# Patient Record
Sex: Male | Born: 1945 | Race: White | Hispanic: No | Marital: Single | Smoking: Current every day smoker
Health system: Southern US, Community
[De-identification: ages and names within clinical notes are randomized; demographics above are authoritative.]

## PROBLEM LIST (undated history)

## (undated) DIAGNOSIS — R7303 Prediabetes: Secondary | ICD-10-CM

## (undated) DIAGNOSIS — I2510 Atherosclerotic heart disease of native coronary artery without angina pectoris: Secondary | ICD-10-CM

## (undated) DIAGNOSIS — I255 Ischemic cardiomyopathy: Secondary | ICD-10-CM

## (undated) DIAGNOSIS — E785 Hyperlipidemia, unspecified: Secondary | ICD-10-CM

## (undated) DIAGNOSIS — I5042 Chronic combined systolic (congestive) and diastolic (congestive) heart failure: Secondary | ICD-10-CM

---

## 2004-12-23 HISTORY — PX: CORONARY ARTERY BYPASS GRAFT: SHX141

## 2005-09-09 ENCOUNTER — Inpatient Hospital Stay (HOSPITAL_COMMUNITY): Admission: AD | Admit: 2005-09-09 | Discharge: 2005-09-14 | Payer: Self-pay | Admitting: Cardiology

## 2005-09-09 ENCOUNTER — Ambulatory Visit: Payer: Self-pay | Admitting: Cardiology

## 2005-09-09 ENCOUNTER — Encounter: Payer: Self-pay | Admitting: Emergency Medicine

## 2005-10-10 ENCOUNTER — Ambulatory Visit: Payer: Self-pay | Admitting: Cardiology

## 2011-07-25 ENCOUNTER — Emergency Department (HOSPITAL_COMMUNITY)
Admission: EM | Admit: 2011-07-25 | Discharge: 2011-07-25 | Disposition: A | Discharge status: Home / Self Care | Payer: Medicare Other | Attending: Emergency Medicine | Admitting: Emergency Medicine

## 2011-07-25 ENCOUNTER — Emergency Department (HOSPITAL_COMMUNITY): Payer: Medicare Other

## 2011-07-25 ENCOUNTER — Other Ambulatory Visit: Payer: Self-pay

## 2011-07-25 DIAGNOSIS — R112 Nausea with vomiting, unspecified: Secondary | ICD-10-CM | POA: Insufficient documentation

## 2011-07-25 DIAGNOSIS — Z951 Presence of aortocoronary bypass graft: Secondary | ICD-10-CM | POA: Insufficient documentation

## 2011-07-25 DIAGNOSIS — R0789 Other chest pain: Secondary | ICD-10-CM | POA: Insufficient documentation

## 2011-07-25 DIAGNOSIS — F17200 Nicotine dependence, unspecified, uncomplicated: Secondary | ICD-10-CM | POA: Insufficient documentation

## 2011-07-25 DIAGNOSIS — R0602 Shortness of breath: Secondary | ICD-10-CM | POA: Insufficient documentation

## 2011-07-25 DIAGNOSIS — R109 Unspecified abdominal pain: Secondary | ICD-10-CM

## 2011-07-25 DIAGNOSIS — I252 Old myocardial infarction: Secondary | ICD-10-CM | POA: Insufficient documentation

## 2011-07-25 DIAGNOSIS — I2510 Atherosclerotic heart disease of native coronary artery without angina pectoris: Secondary | ICD-10-CM | POA: Insufficient documentation

## 2011-07-25 LAB — DIFFERENTIAL
Basophils Absolute: 0 10*3/uL (ref 0.0–0.1)
Eosinophils Absolute: 0.1 10*3/uL (ref 0.0–0.7)
Eosinophils Relative: 1 % (ref 0–5)
Monocytes Relative: 6 % (ref 3–12)
Neutro Abs: 11.1 10*3/uL — ABNORMAL HIGH (ref 1.7–7.7)

## 2011-07-25 LAB — BASIC METABOLIC PANEL
CO2: 29 mEq/L (ref 19–32)
Calcium: 9.5 mg/dL (ref 8.4–10.5)
Creatinine, Ser: 0.88 mg/dL (ref 0.50–1.35)
GFR calc non Af Amer: >60 mL/min (ref >60)
Glucose, Bld: 126 mg/dL — ABNORMAL HIGH (ref 70–99)
Potassium: 4.4 mEq/L (ref 3.5–5.1)
Sodium: 137 mEq/L (ref 135–145)

## 2011-07-25 LAB — CARDIAC PANEL(CRET KIN+CKTOT+MB+TROPI)
CK, MB: 3.1 ng/mL (ref 0.3–4.0)
Relative Index: 2 (ref 0.0–2.5)

## 2011-07-25 LAB — LIPASE, BLOOD: Lipase: 23 U/L (ref 11–59)

## 2011-07-25 LAB — CBC
HCT: 43.5 % (ref 39.0–52.0)
MCH: 31.9 pg (ref 26.0–34.0)
MCHC: 34.7 g/dL (ref 30.0–36.0)
MCV: 92 fL (ref 78.0–100.0)
Platelets: 254 10*3/uL (ref 150–400)
RBC: 4.73 MIL/uL (ref 4.22–5.81)

## 2011-07-25 LAB — APTT: aPTT: 30 seconds (ref 24–37)

## 2011-07-25 LAB — HEPATIC FUNCTION PANEL
AST: 14 U/L (ref 0–37)
Alkaline Phosphatase: 82 U/L (ref 39–117)

## 2011-07-25 LAB — PROTIME-INR: INR: 0.96 (ref 0.00–1.49)

## 2011-07-25 MED ORDER — ONDANSETRON 8 MG PO TBDP: 8.0000 mg | ORAL_TABLET | Freq: Three times a day (TID) | ORAL | Status: AC | PRN

## 2011-07-25 MED ORDER — SODIUM CHLORIDE 0.9 % IV SOLN
INTRAVENOUS | Status: DC
  Administered 2011-07-25: 12:00:00 via INTRAVENOUS

## 2011-07-25 MED ORDER — HYDROMORPHONE HCL 1 MG/ML IJ SOLN
1.0000 mg | Freq: Once | INTRAMUSCULAR | Status: AC
  Administered 2011-07-25: 1 mg via INTRAVENOUS
  Filled 2011-07-25: qty 1

## 2011-07-25 MED ORDER — GI COCKTAIL ~~LOC~~
30.0000 mL | Freq: Once | ORAL | Status: AC
  Administered 2011-07-25: 30 mL via ORAL
  Filled 2011-07-25: qty 30

## 2011-07-25 NOTE — ED Notes (Signed)
EMS reports pt c/o chest pain since 9pm last night.  ALso reports some SOB.  EMS administered 2 baby aspirin in addition to the 2 asa pt took at home and ems also administered 2 nitro sprays.  Pain decreased from 3 to a 2.  Pt describes pain as a pressure in left chest and is nonradiating.

## 2011-07-25 NOTE — ED Provider Notes (Signed)
History    Chart scribed for Travis Quarry, MD by Travis Gutierrez; the patient was seen in room APA02/APA02; this patient's care was started at 12:22 PM.   CSN: [id number] Arrival date & time: 07/25/2011 11:38 AM  Chief Complaint  Patient presents with  . Chest Pain   HPI Travis Gutierrez is a 65 y.o. male who presents to the Emergency Department BIB EMS complaining of chest pressure. Pt states he was awake all night d/t constant chest pressure, rated 2-3/10, onset at 11pm last night while at rest. 2 baby ASA with no relief. Also c/o feeling bloated in his stomach and nausea with v x 3 and dry heaving. Reports "I feel like n/v is more related to my heart than my bloating." Pt states these sx are not similar to past MI sx. Pt reports stress at home d/t family, thinks may be contributing to sx. NTG en route by EMS with no relief, "maybe minimal relief". Denies recent cough, congestion, sob, or f/c. Admits to LE swelling, R>L, unchanged from baseline since MI several years ago.    Past Medical History  Diagnosis Date  . Heart attack     reports intermittent swelling in legs, takes lasix prn    Past Surgical History  Procedure Date  . Cardiac surgery     No family history on file.  History  Substance Use Topics  . Smoking status: Current Everyday Smoker  . Smokeless tobacco: Not on file  . Alcohol Use: No      Review of Systems 10 Systems reviewed and are negative for acute change except as noted in the HPI.  Physical Exam  BP 102/54  Pulse 56  SpO2 100%  Physical Exam  Nursing note and vitals reviewed. Constitutional: He is oriented to person, place, and time. He appears well-developed and well-nourished.  HENT:  Head: Normocephalic and atraumatic.  Right Ear: External ear normal.  Left Ear: External ear normal.  Nose: Nose normal.  Mouth/Throat: Oropharynx is clear and moist.  Eyes: Conjunctivae and EOM are normal. Pupils are equal, round, and reactive to light.    Neck: Normal range of motion. Neck supple.  Cardiovascular: Normal rate, regular rhythm, normal heart sounds and intact distal pulses.   Pulmonary/Chest: Effort normal and breath sounds normal.  Abdominal: Soft. Bowel sounds are normal.  Musculoskeletal: Normal range of motion.  Neurological: He is alert and oriented to person, place, and time. He has normal reflexes.  Skin: Skin is warm and dry.  Psychiatric: He has a normal mood and affect. His behavior is normal. Thought content normal.    ED Course  Procedures  MDM  Date: 07/25/2011  Rate60 Rhythm: normal sinus rhythm  QRS Axis: normal  Intervals: normal  ST/T Wave abnormalities: normal  Conduction Disutrbances:poor r wave progression  Narrative Interpretation:   Old EKG Reviewed: no significant changes noted  2:07 PM Pain improved in ED; only c/o mild aching in epigastrium which is nontender   Results for orders placed during the hospital encounter of 07/25/11  CARDIAC PANEL(CRET KIN+CKTOT+MB+TROPI)      Component Value Range   Total CK 155  7 - 232 (U/L)   CK, MB 3.1  0.3 - 4.0 (ng/mL)   Troponin I <0.30  <0.30 (ng/mL)   Relative Index 2.0  0.0 - 2.5   CBC      Component Value Range   WBC 14.8 (*) 4.0 - 10.5 (K/uL)   RBC 4.73  4.22 - 5.81 (MIL/uL)  Hemoglobin 15.1  13.0 - 17.0 (g/dL)   HCT 40.9  81.1 - 91.4 (%)   MCV 92.0  78.0 - 100.0 (fL)   MCH 31.9  26.0 - 34.0 (pg)   MCHC 34.7  30.0 - 36.0 (g/dL)   RDW 78.2  95.6 - 21.3 (%)   Platelets 254  150 - 400 (K/uL)  DIFFERENTIAL      Component Value Range   Neutrophils Relative 75  43 - 77 (%)   Neutro Abs 11.1 (*) 1.7 - 7.7 (K/uL)   Lymphocytes Relative 18  12 - 46 (%)   Lymphs Abs 2.6  0.7 - 4.0 (K/uL)   Monocytes Relative 6  3 - 12 (%)   Monocytes Absolute 0.9  0.1 - 1.0 (K/uL)   Eosinophils Relative 1  0 - 5 (%)   Eosinophils Absolute 0.1  0.0 - 0.7 (K/uL)   Basophils Relative 0  0 - 1 (%)   Basophils Absolute 0.0  0.0 - 0.1 (K/uL)  BASIC METABOLIC  PANEL      Component Value Range   Sodium 137  135 - 145 (mEq/L)   Potassium 4.4  3.5 - 5.1 (mEq/L)   Chloride 101  96 - 112 (mEq/L)   CO2 29  19 - 32 (mEq/L)   Glucose, Bld 126 (*) 70 - 99 (mg/dL)   BUN 12  6 - 23 (mg/dL)   Creatinine, Ser 0.86  0.50 - 1.35 (mg/dL)   Calcium 9.5  8.4 - 57.8 (mg/dL)   GFR calc non Af Amer >60  >60 (mL/min)   GFR calc Af Amer >60  >60 (mL/min)  LIPASE, BLOOD      Component Value Range   Lipase 23  11 - 59 (U/L)  PROTIME-INR      Component Value Range   Prothrombin Time 13.0  11.6 - 15.2 (seconds)   INR 0.96  0.00 - 1.49   APTT      Component Value Range   aPTT 30  24 - 37 (seconds)  HEPATIC FUNCTION PANEL      Component Value Range   Total Protein 7.7  6.0 - 8.3 (g/dL)   Albumin 3.9  3.5 - 5.2 (g/dL)   AST 14  0 - 37 (U/L)   ALT 15  0 - 53 (U/L)   Alkaline Phosphatase 82  39 - 117 (U/L)   Total Bilirubin 0.6  0.3 - 1.2 (mg/dL)   Bilirubin, Direct 0.1  0.0 - 0.3 (mg/dL)   Indirect Bilirubin 0.5  0.3 - 0.9 (mg/dL)   Dg Chest Portable 1 View  07/25/2011  *RADIOLOGY REPORT*  Clinical Data: Chest pain and pressure, history coronary disease post MI and bypass surgery, smoker  PORTABLE CHEST - 1 VIEW  Comparison: Portable exam 1202 hours compared to 09/12/2005  Findings: Upper normal-sized cardiac silhouette post CABG. Mediastinal contours and pulmonary vascularity normal. Minimal right base atelectasis. Lungs otherwise clear. No gross effusion or pneumothorax. Bones demineralized.  IMPRESSION: Minimal right base atelectasis. Post CABG.  Original Report Authenticated By: Lollie Marrow, M.D.   Dg Abd Acute W/chest  07/25/2011  *RADIOLOGY REPORT*  Clinical Data: Left chest pain, shortness of breath, nausea, altered mental status  ACUTE ABDOMEN SERIES (ABDOMEN 2 VIEW & CHEST 1 VIEW)  Comparison: Chest radiograph 07/25/2011  Findings: Enlargement of cardiac silhouette post CABG. Mediastinal contours and pulmonary vascularity normal. Lungs appear emphysematous  with minimal atelectasis right base. Remaining lungs clear. Minimal peribronchial thickening centrally. No pleural effusion or pneumothorax. Osseous  demineralization. Nonobstructive bowel gas pattern. No bowel dilatation, bowel wall thickening or free intraperitoneal air. No definite urinary tract calcification.  IMPRESSION: Post CABG. Question emphysematous and bronchitic changes with right base atelectasis. No acute intra abdominal findings.  Original Report Authenticated By: Lollie Marrow, M.D.      I personally performed the services described in this documentation, which was scribed in my presence. The recorded information has been reviewed and considered. Travis Quarry, MD    Travis Quarry, MD 07/26/11 [phone]

## 2011-07-25 NOTE — ED Notes (Signed)
Pt c/o left side chest pain that started last pm, sob with nausea noted as well, pain continued this am, pt called ems, pt did take 2 baby asa on his own this am and was given two additional baby asa by ems for a total of 324mg  aspirin, pt was given two nitro spray by ems with little change in pain, pt states that the pain is similar to when he had his mi in the past,

## 2014-10-16 ENCOUNTER — Other Ambulatory Visit: Payer: Self-pay

## 2014-10-16 ENCOUNTER — Encounter (HOSPITAL_COMMUNITY): Payer: Self-pay | Admitting: Emergency Medicine

## 2014-10-16 ENCOUNTER — Inpatient Hospital Stay (HOSPITAL_COMMUNITY)
Admission: EM | Admit: 2014-10-16 | Discharge: 2014-10-20 | DRG: 246 | Disposition: A | Discharge status: Home / Self Care | Payer: Medicare Other | Source: Other Acute Inpatient Hospital | Attending: Cardiovascular Disease | Admitting: Cardiovascular Disease

## 2014-10-16 ENCOUNTER — Encounter (HOSPITAL_COMMUNITY)
Admission: EM | Disposition: A | Discharge status: Home / Self Care | Payer: Medicare Other | Source: Other Acute Inpatient Hospital | Attending: Cardiovascular Disease

## 2014-10-16 DIAGNOSIS — J811 Chronic pulmonary edema: Secondary | ICD-10-CM | POA: Diagnosis not present

## 2014-10-16 DIAGNOSIS — I5043 Acute on chronic combined systolic (congestive) and diastolic (congestive) heart failure: Secondary | ICD-10-CM | POA: Diagnosis present

## 2014-10-16 DIAGNOSIS — I2582 Chronic total occlusion of coronary artery: Secondary | ICD-10-CM | POA: Diagnosis present

## 2014-10-16 DIAGNOSIS — I25119 Atherosclerotic heart disease of native coronary artery with unspecified angina pectoris: Secondary | ICD-10-CM | POA: Diagnosis present

## 2014-10-16 DIAGNOSIS — I5032 Chronic diastolic (congestive) heart failure: Secondary | ICD-10-CM

## 2014-10-16 DIAGNOSIS — I2102 ST elevation (STEMI) myocardial infarction involving left anterior descending coronary artery: Principal | ICD-10-CM | POA: Diagnosis present

## 2014-10-16 DIAGNOSIS — I5020 Unspecified systolic (congestive) heart failure: Secondary | ICD-10-CM

## 2014-10-16 DIAGNOSIS — I213 ST elevation (STEMI) myocardial infarction of unspecified site: Secondary | ICD-10-CM | POA: Diagnosis not present

## 2014-10-16 DIAGNOSIS — R7309 Other abnormal glucose: Secondary | ICD-10-CM | POA: Diagnosis present

## 2014-10-16 DIAGNOSIS — I2510 Atherosclerotic heart disease of native coronary artery without angina pectoris: Secondary | ICD-10-CM | POA: Diagnosis not present

## 2014-10-16 DIAGNOSIS — Z79899 Other long term (current) drug therapy: Secondary | ICD-10-CM | POA: Diagnosis not present

## 2014-10-16 DIAGNOSIS — Z951 Presence of aortocoronary bypass graft: Secondary | ICD-10-CM

## 2014-10-16 DIAGNOSIS — Z9114 Patient's other noncompliance with medication regimen: Secondary | ICD-10-CM | POA: Diagnosis present

## 2014-10-16 DIAGNOSIS — R0602 Shortness of breath: Secondary | ICD-10-CM

## 2014-10-16 DIAGNOSIS — I25810 Atherosclerosis of coronary artery bypass graft(s) without angina pectoris: Secondary | ICD-10-CM | POA: Diagnosis present

## 2014-10-16 DIAGNOSIS — F17210 Nicotine dependence, cigarettes, uncomplicated: Secondary | ICD-10-CM | POA: Diagnosis present

## 2014-10-16 DIAGNOSIS — I255 Ischemic cardiomyopathy: Secondary | ICD-10-CM | POA: Diagnosis present

## 2014-10-16 DIAGNOSIS — I219 Acute myocardial infarction, unspecified: Secondary | ICD-10-CM

## 2014-10-16 DIAGNOSIS — Z7902 Long term (current) use of antithrombotics/antiplatelets: Secondary | ICD-10-CM

## 2014-10-16 DIAGNOSIS — I5022 Chronic systolic (congestive) heart failure: Secondary | ICD-10-CM | POA: Diagnosis not present

## 2014-10-16 DIAGNOSIS — R079 Chest pain, unspecified: Secondary | ICD-10-CM | POA: Diagnosis present

## 2014-10-16 DIAGNOSIS — Z7982 Long term (current) use of aspirin: Secondary | ICD-10-CM

## 2014-10-16 DIAGNOSIS — Z720 Tobacco use: Secondary | ICD-10-CM

## 2014-10-16 DIAGNOSIS — E785 Hyperlipidemia, unspecified: Secondary | ICD-10-CM | POA: Diagnosis present

## 2014-10-16 DIAGNOSIS — I509 Heart failure, unspecified: Secondary | ICD-10-CM | POA: Diagnosis not present

## 2014-10-16 DIAGNOSIS — R7303 Prediabetes: Secondary | ICD-10-CM | POA: Diagnosis present

## 2014-10-16 HISTORY — PX: CARDIAC CATHETERIZATION: SHX172

## 2014-10-16 HISTORY — DX: Chronic combined systolic (congestive) and diastolic (congestive) heart failure: I50.42

## 2014-10-16 HISTORY — DX: Ischemic cardiomyopathy: I25.5

## 2014-10-16 HISTORY — DX: Atherosclerotic heart disease of native coronary artery without angina pectoris: I25.10

## 2014-10-16 HISTORY — DX: Hyperlipidemia, unspecified: E78.5

## 2014-10-16 HISTORY — PX: LEFT HEART CATHETERIZATION WITH CORONARY/GRAFT ANGIOGRAM: SHX5450

## 2014-10-16 HISTORY — DX: Prediabetes: R73.03

## 2014-10-16 LAB — TROPONIN I
Troponin I: >20 ng/mL (ref <0.30)
Troponin I: >20 ng/mL (ref <0.30)

## 2014-10-16 LAB — CREATININE, SERUM
Creatinine, Ser: 0.92 mg/dL (ref 0.50–1.35)
GFR calc Af Amer: >90 mL/min (ref >90)
GFR calc non Af Amer: 83 mL/min — ABNORMAL LOW (ref >90)

## 2014-10-16 LAB — APTT: aPTT: 28 seconds (ref 24–37)

## 2014-10-16 LAB — I-STAT CHEM 8, ED
BUN: 16 mg/dL (ref 6–23)
CREATININE: 0.9 mg/dL (ref 0.50–1.35)
Calcium, Ion: 1.15 mmol/L (ref 1.13–1.30)
Chloride: 104 mEq/L (ref 96–112)
Glucose, Bld: 147 mg/dL — ABNORMAL HIGH (ref 70–99)
HCT: 50 % (ref 39.0–52.0)
Hemoglobin: 17 g/dL (ref 13.0–17.0)
Potassium: 3.9 mEq/L (ref 3.7–5.3)
SODIUM: 142 meq/L (ref 137–147)
TCO2: 26 mmol/L (ref 0–100)

## 2014-10-16 LAB — COMPREHENSIVE METABOLIC PANEL
ALK PHOS: 72 U/L (ref 39–117)
ALT: 16 U/L (ref 0–53)
AST: 37 U/L (ref 0–37)
Albumin: 3.8 g/dL (ref 3.5–5.2)
Anion gap: 15 (ref 5–15)
BUN: 14 mg/dL (ref 6–23)
CO2: 23 meq/L (ref 19–32)
Calcium: 9.1 mg/dL (ref 8.4–10.5)
Chloride: 99 mEq/L (ref 96–112)
Creatinine, Ser: 0.8 mg/dL (ref 0.50–1.35)
GFR calc Af Amer: >90 mL/min (ref >90)
GFR, EST NON AFRICAN AMERICAN: 88 mL/min — AB (ref >90)
GLUCOSE: 147 mg/dL — AB (ref 70–99)
POTASSIUM: 5.7 meq/L — AB (ref 3.7–5.3)
SODIUM: 137 meq/L (ref 137–147)
TOTAL PROTEIN: 7.5 g/dL (ref 6.0–8.3)
Total Bilirubin: 0.4 mg/dL (ref 0.3–1.2)

## 2014-10-16 LAB — CBC
HCT: 44.9 % (ref 39.0–52.0)
HCT: 42.3 % (ref 39.0–52.0)
HEMOGLOBIN: 15.9 g/dL (ref 13.0–17.0)
Hemoglobin: 15.1 g/dL (ref 13.0–17.0)
MCH: 32.3 pg (ref 26.0–34.0)
MCH: 32.1 pg (ref 26.0–34.0)
MCHC: 35.4 g/dL (ref 30.0–36.0)
MCHC: 35.7 g/dL (ref 30.0–36.0)
MCV: 91.3 fL (ref 78.0–100.0)
MCV: 90 fL (ref 78.0–100.0)
PLATELETS: 251 10*3/uL (ref 150–400)
Platelets: 212 10*3/uL (ref 150–400)
RBC: 4.92 MIL/uL (ref 4.22–5.81)
RBC: 4.7 MIL/uL (ref 4.22–5.81)
RDW: 13.6 % (ref 11.5–15.5)
RDW: 13.6 % (ref 11.5–15.5)
WBC: 13.3 10*3/uL — AB (ref 4.0–10.5)
WBC: 14 10*3/uL — ABNORMAL HIGH (ref 4.0–10.5)

## 2014-10-16 LAB — POCT I-STAT TROPONIN I: Troponin i, poc: 0.01 ng/mL (ref 0.00–0.08)

## 2014-10-16 LAB — PROTIME-INR
INR: 0.96 (ref 0.00–1.49)
Prothrombin Time: 12.9 seconds (ref 11.6–15.2)

## 2014-10-16 LAB — TSH: TSH: 0.806 u[IU]/mL (ref 0.350–4.500)

## 2014-10-16 LAB — HEMOGLOBIN A1C
Hgb A1c MFr Bld: 6.3 % — ABNORMAL HIGH (ref <5.7)
Mean Plasma Glucose: 134 mg/dL — ABNORMAL HIGH (ref <117)

## 2014-10-16 LAB — MRSA PCR SCREENING: MRSA by PCR: NEGATIVE

## 2014-10-16 SURGERY — LEFT HEART CATHETERIZATION WITH CORONARY/GRAFT ANGIOGRAM
Anesthesia: LOCAL

## 2014-10-16 MED ORDER — AMIODARONE LOAD VIA INFUSION
150.0000 mg | Freq: Once | INTRAVENOUS | Status: DC
  Filled 2014-10-16: qty 83.34

## 2014-10-16 MED ORDER — ATORVASTATIN CALCIUM 80 MG PO TABS
80.0000 mg | ORAL_TABLET | Freq: Every day | ORAL | Status: DC
Start: 2014-10-16 — End: 2014-10-20
  Administered 2014-10-16 – 2014-10-19 (×4): 80 mg via ORAL
  Filled 2014-10-16 (×5): qty 1

## 2014-10-16 MED ORDER — HEPARIN SODIUM (PORCINE) 5000 UNIT/ML IJ SOLN
4000.0000 [IU] | Freq: Once | INTRAMUSCULAR | Status: AC
  Administered 2014-10-16: 4000 [IU] via INTRAVENOUS
  Filled 2014-10-16: qty 1

## 2014-10-16 MED ORDER — CLOPIDOGREL BISULFATE 75 MG PO TABS: 75.0000 mg | ORAL_TABLET | Freq: Every day | ORAL | Status: DC

## 2014-10-16 MED ORDER — VERAPAMIL HCL 2.5 MG/ML IV SOLN
INTRAVENOUS | Status: AC
  Filled 2014-10-16: qty 2

## 2014-10-16 MED ORDER — HEPARIN SODIUM (PORCINE) 5000 UNIT/ML IJ SOLN
5000.0000 [IU] | Freq: Three times a day (TID) | INTRAMUSCULAR | Status: DC
  Administered 2014-10-16 – 2014-10-20 (×10): 5000 [IU] via SUBCUTANEOUS
  Filled 2014-10-16 (×13): qty 1

## 2014-10-16 MED ORDER — PNEUMOCOCCAL VAC POLYVALENT 25 MCG/0.5ML IJ INJ
0.5000 mL | INJECTION | INTRAMUSCULAR | Status: AC
  Administered 2014-10-17: 0.5 mL via INTRAMUSCULAR
  Filled 2014-10-16: qty 0.5

## 2014-10-16 MED ORDER — NITROGLYCERIN 1 MG/10 ML FOR IR/CATH LAB
INTRA_ARTERIAL | Status: AC
  Filled 2014-10-16: qty 10

## 2014-10-16 MED ORDER — CLOPIDOGREL BISULFATE 75 MG PO TABS
75.0000 mg | ORAL_TABLET | Freq: Every day | ORAL | Status: DC
Start: 2014-10-17 — End: 2014-10-20
  Administered 2014-10-17 – 2014-10-20 (×4): 75 mg via ORAL
  Filled 2014-10-16 (×7): qty 1

## 2014-10-16 MED ORDER — INFLUENZA VAC SPLIT QUAD 0.5 ML IM SUSY
0.5000 mL | PREFILLED_SYRINGE | INTRAMUSCULAR | Status: AC
  Administered 2014-10-17: 0.5 mL via INTRAMUSCULAR
  Filled 2014-10-16: qty 0.5

## 2014-10-16 MED ORDER — AMIODARONE HCL 150 MG/3ML IV SOLN
INTRAVENOUS | Status: AC
  Filled 2014-10-16: qty 3

## 2014-10-16 MED ORDER — ATROPINE SULFATE 0.1 MG/ML IJ SOLN
INTRAMUSCULAR | Status: AC
  Filled 2014-10-16: qty 10

## 2014-10-16 MED ORDER — SODIUM CHLORIDE 0.9 % IV BOLUS (SEPSIS)
1000.0000 mL | Freq: Once | INTRAVENOUS | Status: AC
  Administered 2014-10-16: 1000 mL via INTRAVENOUS

## 2014-10-16 MED ORDER — SODIUM CHLORIDE 0.9 % IV SOLN: INTRAVENOUS | Status: DC

## 2014-10-16 MED ORDER — ACETAMINOPHEN 325 MG PO TABS: 650.0000 mg | ORAL_TABLET | ORAL | Status: DC | PRN

## 2014-10-16 MED ORDER — BIVALIRUDIN BOLUS VIA INFUSION
0.1000 mg/kg | Freq: Once | INTRAVENOUS | Status: DC
  Filled 2014-10-16: qty 9

## 2014-10-16 MED ORDER — BIVALIRUDIN 250 MG IV SOLR
INTRAVENOUS | Status: AC
  Filled 2014-10-16: qty 250

## 2014-10-16 MED ORDER — MORPHINE SULFATE 2 MG/ML IJ SOLN
2.0000 mg | INTRAMUSCULAR | Status: DC | PRN
  Filled 2014-10-16 (×2): qty 1

## 2014-10-16 MED ORDER — ONDANSETRON HCL 4 MG/2ML IJ SOLN: 4.0000 mg | Freq: Four times a day (QID) | INTRAMUSCULAR | Status: DC | PRN

## 2014-10-16 MED ORDER — AMIODARONE HCL IN DEXTROSE 360-4.14 MG/200ML-% IV SOLN
30.0000 mg/h | INTRAVENOUS | Status: DC
  Administered 2014-10-16 – 2014-10-17 (×2): 30 mg/h via INTRAVENOUS
  Filled 2014-10-16 (×5): qty 200

## 2014-10-16 MED ORDER — ASPIRIN 81 MG PO CHEW: 81.0000 mg | CHEWABLE_TABLET | Freq: Every day | ORAL | Status: DC

## 2014-10-16 MED ORDER — SODIUM CHLORIDE 0.9 % IV SOLN
1.7500 mg/kg/h | INTRAVENOUS | Status: DC
  Filled 2014-10-16: qty 250

## 2014-10-16 MED ORDER — FENTANYL CITRATE 0.05 MG/ML IJ SOLN
INTRAMUSCULAR | Status: AC
  Filled 2014-10-16: qty 2

## 2014-10-16 MED ORDER — NITROGLYCERIN 0.4 MG SL SUBL
0.4000 mg | SUBLINGUAL_TABLET | SUBLINGUAL | Status: DC | PRN
  Administered 2014-10-19: 0.4 mg via SUBLINGUAL
  Filled 2014-10-16: qty 1

## 2014-10-16 MED ORDER — METOPROLOL TARTRATE 12.5 MG HALF TABLET
12.5000 mg | ORAL_TABLET | Freq: Two times a day (BID) | ORAL | Status: DC
  Administered 2014-10-16 – 2014-10-20 (×9): 12.5 mg via ORAL
  Filled 2014-10-16 (×10): qty 1

## 2014-10-16 MED ORDER — CLOPIDOGREL BISULFATE 300 MG PO TABS
ORAL_TABLET | ORAL | Status: AC
  Filled 2014-10-16: qty 2

## 2014-10-16 MED ORDER — ASPIRIN EC 81 MG PO TBEC
81.0000 mg | DELAYED_RELEASE_TABLET | Freq: Every day | ORAL | Status: DC
  Administered 2014-10-17 – 2014-10-20 (×4): 81 mg via ORAL
  Filled 2014-10-16 (×4): qty 1

## 2014-10-16 MED ORDER — HEPARIN SODIUM (PORCINE) 5000 UNIT/ML IJ SOLN: 60.0000 [IU]/kg | INTRAMUSCULAR | Status: DC

## 2014-10-16 MED ORDER — ONDANSETRON HCL 4 MG/2ML IJ SOLN
4.0000 mg | Freq: Four times a day (QID) | INTRAMUSCULAR | Status: DC | PRN
  Administered 2014-10-16 – 2014-10-17 (×2): 4 mg via INTRAVENOUS
  Filled 2014-10-16 (×2): qty 2

## 2014-10-16 MED ORDER — SODIUM CHLORIDE 0.9 % IV SOLN
INTRAVENOUS | Status: AC
  Administered 2014-10-16 – 2014-10-17 (×2): via INTRAVENOUS

## 2014-10-16 MED ORDER — LIDOCAINE HCL (PF) 1 % IJ SOLN
INTRAMUSCULAR | Status: AC
  Filled 2014-10-16: qty 30

## 2014-10-16 MED ORDER — HEPARIN (PORCINE) IN NACL 2-0.9 UNIT/ML-% IJ SOLN
INTRAMUSCULAR | Status: AC
  Filled 2014-10-16: qty 1000

## 2014-10-16 MED ORDER — SODIUM CHLORIDE 0.9 % IV SOLN
1.0000 mg/kg/h | INTRAVENOUS | Status: AC
  Administered 2014-10-16: 1 mg/kg/h via INTRAVENOUS
  Filled 2014-10-16 (×2): qty 250

## 2014-10-16 MED ORDER — AMIODARONE HCL IN DEXTROSE 360-4.14 MG/200ML-% IV SOLN
60.0000 mg/h | INTRAVENOUS | Status: AC
  Administered 2014-10-16: 60 mg/h via INTRAVENOUS
  Filled 2014-10-16: qty 200

## 2014-10-16 NOTE — Progress Notes (Signed)
10/16/2014 1735  Rt femoral sheath removed. Pressure held x 20 minutes. Site level 0, pre and post. Instructions given, and  pressure dssg applied.  Bedrest til midnight. Shawntrice Salle, Carolynn Comment

## 2014-10-16 NOTE — ED Notes (Addendum)
Pt presents to department via [facility] for evaluation of STEMI. Pt states midsternal chest pressure radiating to L arm, onset this morning while at home. 6/10 pain upon arrival to ED. Pt also diaphoretic upon arrival. History of MI and CABG. Respirations unlabored. Pt is alert and oriented x4. 18g LAC. Received 324mg  ASA, (1) sublingual nitroglycerin and 5mg  albuterol per EMS.

## 2014-10-16 NOTE — ED Notes (Signed)
Pt taken to the cath lab on cardiac monitor and zoll pads. Reports given to cath lab RN.

## 2014-10-16 NOTE — ED Notes (Addendum)
Cath lab called, they are ready, patient being transported.  Patient received 4000 unit bolus of heparin and 1 liter bolus of normal saline that is infusing now.  Patient transported with Jinny Blossom RN and a tech with cardiac monitor/zoll attached with radiotranslucent pads.

## 2014-10-16 NOTE — H&P (Signed)
   Pt was reexamined and existing H & P reviewed. No changes found.  Lorretta Harp, MD [facility] 10/16/2014 8:45 AM

## 2014-10-16 NOTE — Progress Notes (Signed)
  Echocardiogram 2D Echocardiogram has been performed.  Travis Gutierrez 10/16/2014, 3:11 PM

## 2014-10-16 NOTE — CV Procedure (Signed)
Travis Gutierrez is a 68 y.o. male    [id number] LOCATION:  FACILITY: [facility]  PHYSICIAN: Quay Burow, M.D. [birth year]   DATE OF PROCEDURE:  10/16/2014  DATE OF DISCHARGE:     CARDIAC CATHETERIZATION     History obtained from chart review.Mr. Travis Gutierrez is a 68 year old moderately overweight single Caucasian male who saw Dr. Stanford Breed remotely. He does not follow up with either his cardiologist or his primary care physician. He has a history of CAD status post remote myocardial infarction and cardiac catheterization in 2006 revealing three-vessel disease. He subsequently underwent coronary artery bypass grafting by Dr. Cyndia Bent and was lost to follow-up. He is not taking any medications and he continues to smoke. He developed chest pain at 4 AM and was brought by [facility] to [facility] where his EKG revealed anterior ST segment elevation. He presents now for urgent cardiac catheterization.   PROCEDURE DESCRIPTION:   The patient was brought to the second floor [facility] cath lab in the postabsorptive state. He was not premedicated because of relative hypotension. His right groinwas prepped and shaved in usual sterile fashion. Xylocaine 1% was used for local anesthesia. A 6 French sheath was inserted into the right common femoral artery using standard Seldinger technique.6 French right and left Judkins diagnostic catheters along with a 6 French pigtail catheter were used for selective coronary angiography, selective vein graft angiography, selective IMA angiography and left ventriculography. Visipaque dye was used for the entirety of the case. Retrograde aortic, left ventricular and pullback pressures were recorded.  HEMODYNAMICS:    AO SYSTOLIC/AO DIASTOLIC: 657/84   LV SYSTOLIC/LV DIASTOLIC: 696/29  ANGIOGRAPHIC RESULTS:   1. Left main; normal  2. LAD; 90% proximal, occluded after the first septal perforator 3. Left circumflex; codominant with a moderate sized ramus  branch, occluded first OM, high-grade tandem lesions in the second OM, 90% segmental mid-AV groove followed by 6070% distal AV groove and 75% PLA branch stenosis.  4. Right coronary artery; codominant with a 99% proximal stenosis with damping, proximal 7580% segmental stenosis, tandem 80 and 90% stenoses in the midportion 5.LIMA TO LAD; patent the slow flow after the anastomosis 6. SVG TO RCA was occluded at the origin     SVG TO circumflex marginal branches was occluded at the origin     SVG TO vital branch was widely patent 7. Left ventriculography; RAO left ventriculogram was performed using  25 mL of Visipaque dye at 12 mL/second. The overall LVEF estimated  30-35% %  With wall motion abnormalities notable for severe anteroapical hypokinesia  IMPRESSION:Travis Gutierrez is approximately 4 hours into an acute anterior wall myocardial infarction. He is now 9 years post bypass grafting. His proximal LAD is occluded which most likely represents his culprit vessel although his LIMA is functionally occluded as well. He also has occluded grafts to the circumflex and RCA with high-grade native coronary artery disease. We will proceed with attempt at re-re-recanalization of his culprit vessel was staged intervention on his remaining 2 coronary arteries.  Procedure description: The patient received Angiomax bolus with an ACT of 360. Total contrast administered during the case was 275 mL. D-dimer to balloon time was 42 minutes which was somewhat prolonged because of initial inability to determine the culprit lesion. Using a 6 [place] XB LAD 3.5 cm guide catheter along with an 01/190 cm water guidewire and a 2 mm x 12 mm Euphora balloon the lesion was crossed and angioplasty performed to provide antegrade flow. Following this stenting  was performed of the proximal LAD using a 3 mm x 18 mm long bare metal stent (Vision) at 16 atm. Following this, in an overlapping fashion the very proximal LAD was stented with a 3.5 mm  x 15 mm long bare metal stent. The more distal stent was postdilated with a 3.5 x 20 mm noncompliant balloon up to 18 atm and more proximal stent a 3.75 mm balloon at 18 atm (3.9 mm) resulting reduction of the total occlusion to 0% residual with TIMI 3 flow down to the very apex which point there was a abrupt cut off.  Overall impression: Successful PCI and stenting of an occluded proximal LAD in the setting of an anterior STEMI with bare-metal stents because of medication noncompliance. The patient does have severe LV dysfunction and may be a candidate for a Life Vest prior to the determination of recovery of LV function and the decision to place an ICD for primary prevention. He does have residual CAD in a codominant circumflex, marginal branch and codominant RCA which will need to be addressed in a staged fashion during this hospitalization. The sheath was secured in place. He did have AIVR , reperfusion arrhythmia, and was given an amiodarone bolus and infusion. He left the cath lab in stable condition with blood pressures in the systolic equals 324 mmHg range  Lorretta Harp. MD, [facility] 10/16/2014 10:27 AM

## 2014-10-16 NOTE — H&P (Signed)
Patient ID: Travis Gutierrez MRN: [id number], DOB/AGE: February 07, 1946   Admit date: 10/16/2014  Primary Physician: Default, Provider, MD Primary Cardiologist: Prev seen by Dr. Stanford Gutierrez in 2006;  Lives in [place].  Pt. Profile:  68 y/o male with a h/o CAD s/p prior multivessel CABG in 2006, who presented today with c/p and anterior ST elevation.  Problem List  Past Medical History  Diagnosis Date  . CAD (coronary artery disease)     a. 2006 s/p MI and multivessel CABG.  . Tobacco abuse     Past Surgical History  Procedure Laterality Date  . Coronary artery bypass graft       Allergies  No Known Allergies  HPI  68 y/o male with a prior h/o CAD s/p MI and CABG in 2006.  He has not seen a physician and has not been on any regular meds since that time.  He has continued to smoke.  He was in his usual state of health until early this AM when he had sudden onset of severe sscp and dyspnea.  EMS was called and he was found to have 1-18mm ST elevation in V2-V5 with TWI in I/aVL, left axis, poor R progression, prior inf infarct.  This is markedly different from ECG in 2012.  A Code STEMI was called and he was taken to the [facility] cath lab for emergent diagnostic catheterization.  Home Medications  Prior to Admission medications   Not on File   Family History  Family History  Problem Relation Age of Onset  . Other      unable to obtain at this time 2/2 acuity.   Social History  History   Social History  . Marital Status: Single    Spouse Name: N/A    Number of Children: N/A  . Years of Education: N/A   Occupational History  . Not on file.   Social History Main Topics  . Smoking status: Current Every Day Smoker -- 1.00 packs/day for 50 years    Types: Cigarettes  . Smokeless tobacco: Not on file  . Alcohol Use: No  . Drug Use: No  . Sexual Activity: Not on file   Other Topics Concern  . Not on file   Social History Narrative   Lives in [place] by himself.   Does not routinely exercise.  On no meds/hasn't seen MD in ~ 10 yrs.    Review of Systems General:  No chills, fever, night sweats or weight changes.  Cardiovascular:  +++ chest pain, dyspnea on exertion, no edema, orthopnea, palpitations, paroxysmal nocturnal dyspnea. Dermatological: No rash, lesions/masses Respiratory: No cough, dyspnea Urologic: No hematuria, dysuria Abdominal:   No nausea, vomiting, diarrhea, bright red blood per rectum, melena, or hematemesis Neurologic:  No visual changes, wkns, changes in mental status. All other systems reviewed and are otherwise negative except as noted above.  Physical Exam  Blood pressure 138/68, pulse 79, temperature 97.6 F (36.4 C), temperature source Oral, resp. rate 18, height 6' (1.829 m), weight 180 lb (81.647 kg), SpO2 97.00%.  General: Pleasant, NAD Psych: Normal affect. Neuro: Alert and oriented X 3. Moves all extremities spontaneously. HEENT: Normal  Neck: Supple without bruits or JVD. Lungs:  Resp regular and unlabored, CTA. Heart: RRR no s3, s4, or murmurs. Abdomen: Soft, non-tender, non-distended, BS + x 4.  Extremities: No clubbing, cyanosis or edema. DP/PT/Radials 2+ and equal bilaterally.  Labs  Troponin [facility] of Care Test)  Recent Labs  10/16/14 0832  TROPIPOC 0.01  Lab Results  Component Value Date   WBC 13.3* 10/16/2014   HGB 15.9 10/16/2014   HGB 17.0 10/16/2014   HCT 44.9 10/16/2014   HCT 50.0 10/16/2014   MCV 91.3 10/16/2014   PLT 251 10/16/2014    Recent Labs Lab 10/16/14 0834  NA 142  K 3.9  CL 104  BUN 16  CREATININE 0.90  GLUCOSE 147*   Radiology/Studies  No results found.  ECG  ST, 129, inf infarct, LAD, poor R progression, 1-64mm ST elev in V2-V5, TWI I/aVL.  ASSESSMENT AND PLAN  1.  Acute anterior STEMI/CAD:  S/p prior CABG in 2006.  No f/u since 2006.  On no meds @ home.  Ss started @ ~ 4 AM this morning.  ECG with anterior ST elevation along with new evidence of inf  infarct/ant infarct (not present in 2012).  Emergent cath ongoing.  Will plan to admit to CCU post-cath.  Add asa, bb, acei, high potency statin.  Will need smoking cessation and eventual cardiac rehab.  Check echo.  2.  Tob Abuse:  Will need smoking cessation counseling.  3.  Lipids:  Check lipids/lft's. Add high potency statin.  Signed, Travis Hodgkins, NP 10/16/2014, 8:52 AM   Agree with note performed by Travis Hodgkins NP  Mr. Travis Gutierrez is a 68 year old patient with a history of remote coronary artery bypass grafting in 2006. He has not sought follow-up since that time. He developed chest pain at 4 AM this morning was brought by [facility] to [facility] where he was found to have ST segment elevation in the anterior leads. He was brought emergently to the cardiac catheterization laboratory for angiography and intervention. He doesn't smoke. He is currently not on any medications.   Travis Gutierrez, M.D., [place], [facility], [place] [address] [place], [place]  [id number]  [phone] 10/16/2014 10:44 AM

## 2014-10-16 NOTE — ED Provider Notes (Signed)
CSN: [id number]     Arrival date & time 10/16/14  0737 History   None    Chief Complaint  Patient presents with  . Chest Pain     (Consider location/radiation/quality/duration/timing/severity/associated sxs/prior Treatment) HPI Comments: Pt is a 68 y/o male wioth hx of MI and CABG in the past - also has hx of high cholesterol for which he takes no meds for anything.  He does smoke cigarettes - he has been having increasing in frequency episodes of CP and today this morning had acute onset of severe heaviness in his chest with diaphoresis and SOB, he was given nitro and ASA enroute.  He has improved but persistent sx.    Patient is a 68 y.o. male presenting with chest pain. The history is provided by the patient and the EMS personnel.  Chest Pain   Past Medical History  Diagnosis Date  . CAD (coronary artery disease)     a. 2006 s/p MI and multivessel CABG.  . Tobacco abuse    Past Surgical History  Procedure Laterality Date  . Coronary artery bypass graft     Family History  Problem Relation Age of Onset  . Other      unable to obtain at this time 2/2 acuity.   History  Substance Use Topics  . Smoking status: Current Every Day Smoker -- 1.00 packs/day for 50 years    Types: Cigarettes  . Smokeless tobacco: Not on file  . Alcohol Use: No    Review of Systems  Cardiovascular: Positive for chest pain.  All other systems reviewed and are negative.     Allergies  Review of patient's allergies indicates no known allergies.  Home Medications   Prior to Admission medications   Not on File   BP 111/67  Pulse 77  Temp(Src) 98.4 F (36.9 C) (Oral)  Resp 23  Ht 6' (1.829 m)  Wt 201 lb 8 oz (91.4 kg)  BMI 27.32 kg/m2  SpO2 94% Physical Exam  Nursing note and vitals reviewed. Constitutional: He appears well-developed and well-nourished. He appears distressed.  HENT:  Head: Normocephalic and atraumatic.  Mouth/Throat: Oropharynx is clear and moist. No  oropharyngeal exudate.  Eyes: Conjunctivae and EOM are normal. Pupils are equal, round, and reactive to light. Right eye exhibits no discharge. Left eye exhibits no discharge. No scleral icterus.  Neck: Normal range of motion. Neck supple. No JVD present. No thyromegaly present.  Cardiovascular: Normal rate, regular rhythm, normal heart sounds and intact distal pulses.  Exam reveals no gallop and no friction rub.   No murmur heard. Pulmonary/Chest: Effort normal and breath sounds normal. No respiratory distress. He has no wheezes. He has no rales.  Abdominal: Soft. Bowel sounds are normal. He exhibits no distension and no mass. There is no tenderness.  Musculoskeletal: Normal range of motion. He exhibits no edema and no tenderness.  Lymphadenopathy:    He has no cervical adenopathy.  Neurological: He is alert. Coordination normal.  Skin: Skin is warm. No rash noted. He is diaphoretic. No erythema.  Psychiatric: He has a normal mood and affect. His behavior is normal.    ED Course  Procedures (including critical care time) Labs Review Labs Reviewed  CBC - Abnormal; Notable for the following:    WBC 13.3 (*)    All other components within normal limits  COMPREHENSIVE METABOLIC PANEL - Abnormal; Notable for the following:    Potassium 5.7 (*)    Glucose, Bld 147 (*)  GFR calc non Af Amer 88 (*)    All other components within normal limits  TROPONIN I - Abnormal; Notable for the following:    Troponin I >20.00 (*)    All other components within normal limits  CBC - Abnormal; Notable for the following:    WBC 14.0 (*)    All other components within normal limits  CREATININE, SERUM - Abnormal; Notable for the following:    GFR calc non Af Amer 83 (*)    All other components within normal limits  TROPONIN I - Abnormal; Notable for the following:    Troponin I >20.00 (*)    All other components within normal limits  TROPONIN I - Abnormal; Notable for the following:    Troponin I  >20.00 (*)    All other components within normal limits  I-STAT CHEM 8, ED - Abnormal; Notable for the following:    Glucose, Bld 147 (*)    All other components within normal limits  MRSA PCR SCREENING  APTT  PROTIME-INR  TSH  TROPONIN I  TROPONIN I  HEMOGLOBIN A1C  COMPREHENSIVE METABOLIC PANEL  CBC  LIPID PANEL  I-STAT TROPOININ, ED  POCT I-STAT TROPONIN I    Imaging Review No results found.   EKG Interpretation   Date/Time:  Sunday October 16 2014 08:22:47 EDT Ventricular Rate:  106 PR Interval:  119 QRS Duration: 93 QT Interval:  364 QTC Calculation: 483 R Axis:   68 Text Interpretation:  Sinus tachycardia with irregular rate Anterior  infarct, acute (LAD) ** ** ACUTE MI / STEMI ** ** Abnormal ekg Since last  tracing ** ** ACUTE MI / STEMI ** ** NOW PRESENT Confirmed by Dian Minahan  MD,  Zadyn Yardley ([id number]) on 10/16/2014 8:31:52 AM      MDM   Final diagnoses:  ST elevation myocardial infarction involving left anterior descending (LAD) coronary artery  Acute ST elevation myocardial infarction (STEMI) involving left anterior descending coronary artery    The pt has a STEMI on his ECG - activeated from the field by EMS - BP is soft - fluid bolus given, nitro held, Heparin started, The pt is critically ill.  The STEMI team is coming in, Dr. Gwenlyn Found is aware.  I discussed these findings with the catheterization lab team, and they will accept the patient to the Cath Lab for an emergent heart catheterization      Johnna Acosta, MD 10/16/14 2013

## 2014-10-17 ENCOUNTER — Inpatient Hospital Stay (HOSPITAL_COMMUNITY): Payer: Medicare Other

## 2014-10-17 DIAGNOSIS — I2102 ST elevation (STEMI) myocardial infarction involving left anterior descending coronary artery: Secondary | ICD-10-CM | POA: Diagnosis not present

## 2014-10-17 LAB — LIPID PANEL
Cholesterol: 192 mg/dL (ref 0–200)
HDL: 26 mg/dL — AB (ref >39)
LDL CALC: 128 mg/dL — AB (ref 0–99)
TRIGLYCERIDES: 188 mg/dL — AB (ref <150)
Total CHOL/HDL Ratio: 7.4 RATIO
VLDL: 38 mg/dL (ref 0–40)

## 2014-10-17 LAB — CBC
HEMATOCRIT: 42.1 % (ref 39.0–52.0)
Hemoglobin: 14.5 g/dL (ref 13.0–17.0)
MCH: 31.5 pg (ref 26.0–34.0)
MCHC: 34.4 g/dL (ref 30.0–36.0)
MCV: 91.5 fL (ref 78.0–100.0)
Platelets: 223 10*3/uL (ref 150–400)
RBC: 4.6 MIL/uL (ref 4.22–5.81)
RDW: 13.9 % (ref 11.5–15.5)
WBC: 15.6 10*3/uL — AB (ref 4.0–10.5)

## 2014-10-17 LAB — COMPREHENSIVE METABOLIC PANEL
ALBUMIN: 3.4 g/dL — AB (ref 3.5–5.2)
ALT: 64 U/L — ABNORMAL HIGH (ref 0–53)
ANION GAP: 13 (ref 5–15)
AST: 291 U/L — ABNORMAL HIGH (ref 0–37)
Alkaline Phosphatase: 73 U/L (ref 39–117)
BUN: 13 mg/dL (ref 6–23)
CO2: 24 meq/L (ref 19–32)
CREATININE: 1 mg/dL (ref 0.50–1.35)
Calcium: 8.9 mg/dL (ref 8.4–10.5)
Chloride: 104 mEq/L (ref 96–112)
GFR calc Af Amer: 86 mL/min — ABNORMAL LOW (ref >90)
GFR, EST NON AFRICAN AMERICAN: 74 mL/min — AB (ref >90)
Glucose, Bld: 151 mg/dL — ABNORMAL HIGH (ref 70–99)
POTASSIUM: 4.3 meq/L (ref 3.7–5.3)
Sodium: 141 mEq/L (ref 137–147)
Total Bilirubin: 0.6 mg/dL (ref 0.3–1.2)
Total Protein: 6.6 g/dL (ref 6.0–8.3)

## 2014-10-17 LAB — PRO B NATRIURETIC PEPTIDE: PRO B NATRI PEPTIDE: 3298 pg/mL — AB (ref 0–125)

## 2014-10-17 LAB — TROPONIN I

## 2014-10-17 LAB — POCT ACTIVATED CLOTTING TIME: Activated Clotting Time: 360 seconds

## 2014-10-17 MED ORDER — FUROSEMIDE 40 MG PO TABS
40.0000 mg | ORAL_TABLET | Freq: Every day | ORAL | Status: DC
  Administered 2014-10-17 – 2014-10-18 (×2): 40 mg via ORAL
  Filled 2014-10-17 (×2): qty 1

## 2014-10-17 MED ORDER — FUROSEMIDE 10 MG/ML IJ SOLN
40.0000 mg | Freq: Once | INTRAMUSCULAR | Status: AC
  Administered 2014-10-17: 40 mg via INTRAVENOUS
  Filled 2014-10-17: qty 4

## 2014-10-17 MED ORDER — LISINOPRIL 2.5 MG PO TABS
2.5000 mg | ORAL_TABLET | Freq: Every day | ORAL | Status: DC
  Administered 2014-10-17 – 2014-10-20 (×4): 2.5 mg via ORAL
  Filled 2014-10-17 (×4): qty 1

## 2014-10-17 MED FILL — Sodium Chloride IV Soln 0.9%: INTRAVENOUS | Qty: 50 | Status: AC

## 2014-10-17 NOTE — Progress Notes (Signed)
Subjective:  No CP/SOB, POD #1 Ant STEMI Rx with PCI/Stent  Objective:  Temp:  [97.9 F (36.6 C)-100.6 F (38.1 C)] 100.6 F (38.1 C) (10/26 0700) Pulse Rate:  [71-95] 84 (10/26 0700) Resp:  [12-27] 23 (10/26 0800) BP: (85-127)/(24-92) 111/56 mmHg (10/26 0800) SpO2:  [87 %-100 %] 89 % (10/26 0800) Arterial Line BP: (109-147)/(60-92) 115/65 mmHg (10/25 1700) Weight:  [201 lb 8 oz (91.4 kg)-210 lb (95.255 kg)] 210 lb (95.255 kg) (10/26 0400) Weight change:   Intake/Output from previous day: 10/25 0701 - 10/26 0700 In: 2003.3 [P.O.:200; I.V.:1803.3] Out: 1900 [Urine:1900]  Intake/Output from this shift: Total I/O In: 16.7 [I.V.:16.7] Out: -   Physical Exam: General appearance: alert and no distress Neck: no adenopathy, no carotid bruit, no JVD, supple, symmetrical, trachea midline and thyroid not enlarged, symmetric, no tenderness/mass/nodules Lungs: clear to auscultation bilaterally Heart: regular rate and rhythm, S1, S2 normal, no murmur, click, rub or gallop Extremities: extremities normal, atraumatic, no cyanosis or edema and Right groin OK  Lab Results: Results for orders placed during the hospital encounter of 10/16/14 (from the past 48 hour(s))  POCT I-STAT TROPONIN I     Status: None   Collection Time    10/16/14  8:32 AM      Result Value Ref Range   Troponin i, poc 0.01  0.00 - 0.08 ng/mL   Comment 3            Comment: Due to the release kinetics of cTnI,     a negative result within the first hours     of the onset of symptoms does not rule out     myocardial infarction with certainty.     If myocardial infarction is still suspected,     repeat the test at appropriate intervals.  APTT     Status: None   Collection Time    10/16/14  8:34 AM      Result Value Ref Range   aPTT 28  24 - 37 seconds  CBC     Status: Abnormal   Collection Time    10/16/14  8:34 AM      Result Value Ref Range   WBC 13.3 (*) 4.0 - 10.5 K/uL   RBC 4.92  4.22 - 5.81  MIL/uL   Hemoglobin 15.9  13.0 - 17.0 g/dL   HCT 44.9  39.0 - 52.0 %   MCV 91.3  78.0 - 100.0 fL   MCH 32.3  26.0 - 34.0 pg   MCHC 35.4  30.0 - 36.0 g/dL   RDW 13.6  11.5 - 15.5 %   Platelets 251  150 - 400 K/uL  COMPREHENSIVE METABOLIC PANEL     Status: Abnormal   Collection Time    10/16/14  8:34 AM      Result Value Ref Range   Sodium 137  137 - 147 mEq/L   Potassium 5.7 (*) 3.7 - 5.3 mEq/L   Comment: HEMOLYSIS AT THIS LEVEL MAY AFFECT RESULT   Chloride 99  96 - 112 mEq/L   CO2 23  19 - 32 mEq/L   Glucose, Bld 147 (*) 70 - 99 mg/dL   BUN 14  6 - 23 mg/dL   Creatinine, Ser 0.80  0.50 - 1.35 mg/dL   Calcium 9.1  8.4 - 10.5 mg/dL   Total Protein 7.5  6.0 - 8.3 g/dL   Albumin 3.8  3.5 - 5.2 g/dL   AST 37  0 - 37  U/L   Comment: HEMOLYSIS AT THIS LEVEL MAY AFFECT RESULT   ALT 16  0 - 53 U/L   Comment: HEMOLYSIS AT THIS LEVEL MAY AFFECT RESULT   Alkaline Phosphatase 72  39 - 117 U/L   Comment: HEMOLYSIS AT THIS LEVEL MAY AFFECT RESULT   Total Bilirubin 0.4  0.3 - 1.2 mg/dL   GFR calc non Af Amer 88 (*) >90 mL/min   GFR calc Af Amer >90  >90 mL/min   Comment: (NOTE)     The eGFR has been calculated using the CKD EPI equation.     This calculation has not been validated in all clinical situations.     eGFR's persistently <90 mL/min signify possible Chronic Kidney     Disease.   Anion gap 15  5 - 15  PROTIME-INR     Status: None   Collection Time    10/16/14  8:34 AM      Result Value Ref Range   Prothrombin Time 12.9  11.6 - 15.2 seconds   INR 0.96  0.00 - 1.49  I-STAT CHEM 8, ED     Status: Abnormal   Collection Time    10/16/14  8:34 AM      Result Value Ref Range   Sodium 142  137 - 147 mEq/L   Potassium 3.9  3.7 - 5.3 mEq/L   Chloride 104  96 - 112 mEq/L   BUN 16  6 - 23 mg/dL   Creatinine, Ser 0.90  0.50 - 1.35 mg/dL   Glucose, Bld 147 (*) 70 - 99 mg/dL   Calcium, Ion 1.15  1.13 - 1.30 mmol/L   TCO2 26  0 - 100 mmol/L   Hemoglobin 17.0  13.0 - 17.0 g/dL   HCT  50.0  39.0 - 52.0 %  MRSA PCR SCREENING     Status: None   Collection Time    10/16/14 10:28 AM      Result Value Ref Range   MRSA by PCR NEGATIVE  NEGATIVE   Comment:            The GeneXpert MRSA Assay (FDA     approved for NASAL specimens     only), is one component of a     comprehensive MRSA colonization     surveillance program. It is not     intended to diagnose MRSA     infection nor to guide or     monitor treatment for     MRSA infections.  TROPONIN I     Status: Abnormal   Collection Time    10/16/14  1:05 PM      Result Value Ref Range   Troponin I >20.00 (*) <0.30 ng/mL   Comment:            Due to the release kinetics of cTnI,     a negative result within the first hours     of the onset of symptoms does not rule out     myocardial infarction with certainty.     If myocardial infarction is still suspected,     repeat the test at appropriate intervals.     CRITICAL VALUE NOTED.  VALUE IS CONSISTENT WITH PREVIOUSLY REPORTED AND CALLED VALUE.  CBC     Status: Abnormal   Collection Time    10/16/14  1:05 PM      Result Value Ref Range   WBC 14.0 (*) 4.0 - 10.5 K/uL   RBC 4.70  4.22 -  5.81 MIL/uL   Hemoglobin 15.1  13.0 - 17.0 g/dL   HCT 42.3  39.0 - 52.0 %   MCV 90.0  78.0 - 100.0 fL   MCH 32.1  26.0 - 34.0 pg   MCHC 35.7  30.0 - 36.0 g/dL   RDW 13.6  11.5 - 15.5 %   Platelets 212  150 - 400 K/uL  CREATININE, SERUM     Status: Abnormal   Collection Time    10/16/14  1:05 PM      Result Value Ref Range   Creatinine, Ser 0.92  0.50 - 1.35 mg/dL   GFR calc non Af Amer 83 (*) >90 mL/min   GFR calc Af Amer >90  >90 mL/min   Comment: (NOTE)     The eGFR has been calculated using the CKD EPI equation.     This calculation has not been validated in all clinical situations.     eGFR's persistently <90 mL/min signify possible Chronic Kidney     Disease.  TSH     Status: None   Collection Time    10/16/14  1:05 PM      Result Value Ref Range   TSH 0.806  0.350  - 4.500 uIU/mL  TROPONIN I     Status: Abnormal   Collection Time    10/16/14  1:05 PM      Result Value Ref Range   Troponin I >20.00 (*) <0.30 ng/mL   Comment:            Due to the release kinetics of cTnI,     a negative result within the first hours     of the onset of symptoms does not rule out     myocardial infarction with certainty.     If myocardial infarction is still suspected,     repeat the test at appropriate intervals.     CRITICAL RESULT CALLED TO, READ BACK BY AND VERIFIED WITH:     CULLUMS,R RN 10/16/14 1408 [place] A1C     Status: Abnormal   Collection Time    10/16/14  1:05 PM      Result Value Ref Range   Hemoglobin A1C 6.3 (*) <5.7 %   Comment: (NOTE)                                                                               According to the ADA Clinical Practice Recommendations for 2011, when     HbA1c is used as a screening test:      >=6.5%   Diagnostic of Diabetes Mellitus               (if abnormal result is confirmed)     5.7-6.4%   Increased risk of developing Diabetes Mellitus     References:Diagnosis and Classification of Diabetes Mellitus,Diabetes     MLYY,5035,46(FKCLE 1):S62-S69 and Standards of Medical Care in             Diabetes - 2011,Diabetes Care,2011,34 (Suppl 1):S11-S61.   Mean Plasma Glucose 134 (*) <117 mg/dL   Comment: Performed at [facility]  TROPONIN I     Status: Abnormal   Collection Time  10/16/14  6:13 PM      Result Value Ref Range   Troponin I >20.00 (*) <0.30 ng/mL   Comment:            Due to the release kinetics of cTnI,     a negative result within the first hours     of the onset of symptoms does not rule out     myocardial infarction with certainty.     If myocardial infarction is still suspected,     repeat the test at appropriate intervals.     CRITICAL VALUE NOTED.  VALUE IS CONSISTENT WITH PREVIOUSLY REPORTED AND CALLED VALUE.  TROPONIN I     Status: Abnormal   Collection Time     10/16/14 11:00 PM      Result Value Ref Range   Troponin I >20.00 (*) <0.30 ng/mL   Comment:            Due to the release kinetics of cTnI,     a negative result within the first hours     of the onset of symptoms does not rule out     myocardial infarction with certainty.     If myocardial infarction is still suspected,     repeat the test at appropriate intervals.     CRITICAL VALUE NOTED.  VALUE IS CONSISTENT WITH PREVIOUSLY REPORTED AND CALLED VALUE.  COMPREHENSIVE METABOLIC PANEL     Status: Abnormal   Collection Time    10/17/14  2:23 AM      Result Value Ref Range   Sodium 141  137 - 147 mEq/L   Potassium 4.3  3.7 - 5.3 mEq/L   Chloride 104  96 - 112 mEq/L   CO2 24  19 - 32 mEq/L   Glucose, Bld 151 (*) 70 - 99 mg/dL   BUN 13  6 - 23 mg/dL   Creatinine, Ser 1.00  0.50 - 1.35 mg/dL   Calcium 8.9  8.4 - 10.5 mg/dL   Total Protein 6.6  6.0 - 8.3 g/dL   Albumin 3.4 (*) 3.5 - 5.2 g/dL   AST 291 (*) 0 - 37 U/L   ALT 64 (*) 0 - 53 U/L   Alkaline Phosphatase 73  39 - 117 U/L   Total Bilirubin 0.6  0.3 - 1.2 mg/dL   GFR calc non Af Amer 74 (*) >90 mL/min   GFR calc Af Amer 86 (*) >90 mL/min   Comment: (NOTE)     The eGFR has been calculated using the CKD EPI equation.     This calculation has not been validated in all clinical situations.     eGFR's persistently <90 mL/min signify possible Chronic Kidney     Disease.   Anion gap 13  5 - 15  CBC     Status: Abnormal   Collection Time    10/17/14  2:23 AM      Result Value Ref Range   WBC 15.6 (*) 4.0 - 10.5 K/uL   RBC 4.60  4.22 - 5.81 MIL/uL   Hemoglobin 14.5  13.0 - 17.0 g/dL   HCT 42.1  39.0 - 52.0 %   MCV 91.5  78.0 - 100.0 fL   MCH 31.5  26.0 - 34.0 pg   MCHC 34.4  30.0 - 36.0 g/dL   RDW 13.9  11.5 - 15.5 %   Platelets 223  150 - 400 K/uL  LIPID PANEL     Status: Abnormal   Collection Time  10/17/14  2:23 AM      Result Value Ref Range   Cholesterol 192  0 - 200 mg/dL   Triglycerides 188 (*) <150 mg/dL    HDL 26 (*) >39 mg/dL   Total CHOL/HDL Ratio 7.4     VLDL 38  0 - 40 mg/dL   LDL Cholesterol 128 (*) 0 - 99 mg/dL   Comment:            Total Cholesterol/HDL:CHD Risk     Coronary Heart Disease Risk Table                         Men   Women      1/2 Average Risk   3.4   3.3      Average Risk       5.0   4.4      2 X Average Risk   9.6   7.1      3 X Average Risk  23.4   11.0                Use the calculated Patient Ratio     above and the CHD Risk Table     to determine the patient's CHD Risk.                ATP III CLASSIFICATION (LDL):      <100     mg/dL   Optimal      100-129  mg/dL   Near or Above                        Optimal      130-159  mg/dL   Borderline      160-189  mg/dL   High      >190     mg/dL   Very High    Imaging: Imaging results have been reviewed  Tele: NSR   Assessment/Plan:   1. Active Problems: 2.   Acute ST elevation myocardial infarction (STEMI) involving left anterior descending coronary artery 3.   ST elevation MI (STEMI) 4. ISCM, EF 35%  Time Spent Directly with Patient:  25 minutes  Length of Stay:  LOS: 1 day   POD # 1 Ant STEMI Rx with PCI/Stent BMS secondary to medication non compliance. Trop > 20. EKG improved. EF 35% by 2D. No CP. He was c/o orthopnea however and received lasix 40 mg IV (900 cc diuresis). Renal Fxn OK. Will check PCXR, start oral diuretic and ACE-I. 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 Will need staged revasc this admission. On DAPT with Plavix. CRH. CRF modif with smoking cessation.  Lorretta Harp 10/17/2014, 9:11 AM

## 2014-10-17 NOTE — Progress Notes (Signed)
CARDIAC REHAB PHASE I   PRE:  Rate/Rhythm: 83 SR    BP: sitting 112/56    SaO2: 91 4L  MODE:  Ambulation: 350 ft   POST:  Rate/Rhythm: 107 ST    BP: sitting 115/52     SaO2: 95 6L  Pt tolerated fairly well but needed O2, 6L. SOB toward end, esp after sitting. Denied chest tightness. Began ed. Pt wants to quit smoking and plans to try cold [place]. This appears that it will be difficult as he has many stresses caring for his sister who smokes. Gave resources. Will continue education process. [id number]   Josephina Shih [place] CES, ACSM 10/17/2014 11:27 AM

## 2014-10-17 NOTE — Progress Notes (Signed)
Patient is complaining of shortness of breath and inability to lay down flat on the bed to sleep. Crackles appreciated upon auscultation of lungs. Furosemide given per MDs orders.

## 2014-10-17 NOTE — Progress Notes (Signed)
Utilization Review Completed.Travis Gutierrez T10/26/2015  

## 2014-10-18 DIAGNOSIS — I5020 Unspecified systolic (congestive) heart failure: Secondary | ICD-10-CM

## 2014-10-18 LAB — GLUCOSE, CAPILLARY
GLUCOSE-CAPILLARY: 143 mg/dL — AB (ref 70–99)
GLUCOSE-CAPILLARY: 100 mg/dL — AB (ref 70–99)

## 2014-10-18 MED ORDER — FUROSEMIDE 10 MG/ML IJ SOLN
40.0000 mg | Freq: Two times a day (BID) | INTRAMUSCULAR | Status: DC
  Administered 2014-10-18 – 2014-10-20 (×4): 40 mg via INTRAVENOUS
  Filled 2014-10-18 (×6): qty 4

## 2014-10-18 NOTE — Progress Notes (Signed)
CARDIAC REHAB PHASE I   PRE:  Rate/Rhythm: 81 SR  BP:  Supine:   Sitting: 107/45  Standing:    SaO2: 95 4L  MODE:  Ambulation: 350 ft   POST:  Rate/Rhythm: 102 ST  BP:  Supine:   Sitting: 112/51  Standing:    SaO2: 97 6L 1450-1515 Assisted X 1 and used O2 6L to ambulate. Gait steady. Pt able to walk 350 feet without c/o of cp or SOB. VS stable Pt back to recliner after walk with call light in reach. Pt states that he feels better today and walk felt easier.  Rodney Langton RN 10/18/2014 3:35 PM

## 2014-10-18 NOTE — Progress Notes (Signed)
Subjective:  No CP/SOB  Objective:  Temp:  [97.8 F (36.6 C)-98.7 F (37.1 C)] 97.8 F (36.6 C) (10/27 0800) Pulse Rate:  [83-137] 91 (10/27 0800) BP: (90-127)/(40-71) 118/53 mmHg (10/27 0800) SpO2:  [87 %-97 %] 94 % (10/27 0800) Weight:  [207 lb 14.3 oz (94.3 kg)] 207 lb 14.3 oz (94.3 kg) (10/27 0609) Weight change: 27 lb 14.3 oz (12.653 kg)  Intake/Output from previous day: 10/26 0701 - 10/27 0700 In: 593.4 [P.O.:560; I.V.:33.4] Out: 979 [Urine:977; Stool:2]  Intake/Output from this shift: Total I/O In: 240 [P.O.:240] Out: -   Physical Exam: General appearance: alert and no distress Neck: no adenopathy, no carotid bruit, no JVD, supple, symmetrical, trachea midline and thyroid not enlarged, symmetric, no tenderness/mass/nodules Lungs: clear to auscultation bilaterally Heart: regular rate and rhythm, S1, S2 normal, no murmur, click, rub or gallop Extremities: extremities normal, atraumatic, no cyanosis or edema  Lab Results: Results for orders placed during the hospital encounter of 10/16/14 (from the past 48 hour(s))  TROPONIN I     Status: Abnormal   Collection Time    10/16/14  1:05 PM      Result Value Ref Range   Troponin I >20.00 (*) <0.30 ng/mL   Comment:            Due to the release kinetics of cTnI,     a negative result within the first hours     of the onset of symptoms does not rule out     myocardial infarction with certainty.     If myocardial infarction is still suspected,     repeat the test at appropriate intervals.     CRITICAL VALUE NOTED.  VALUE IS CONSISTENT WITH PREVIOUSLY REPORTED AND CALLED VALUE.  CBC     Status: Abnormal   Collection Time    10/16/14  1:05 PM      Result Value Ref Range   WBC 14.0 (*) 4.0 - 10.5 K/uL   RBC 4.70  4.22 - 5.81 MIL/uL   Hemoglobin 15.1  13.0 - 17.0 g/dL   HCT 42.3  39.0 - 52.0 %   MCV 90.0  78.0 - 100.0 fL   MCH 32.1  26.0 - 34.0 pg   MCHC 35.7  30.0 - 36.0 g/dL   RDW 13.6  11.5 - 15.5 %   Platelets 212  150 - 400 K/uL  CREATININE, SERUM     Status: Abnormal   Collection Time    10/16/14  1:05 PM      Result Value Ref Range   Creatinine, Ser 0.92  0.50 - 1.35 mg/dL   GFR calc non Af Amer 83 (*) >90 mL/min   GFR calc Af Amer >90  >90 mL/min   Comment: (NOTE)     The eGFR has been calculated using the CKD EPI equation.     This calculation has not been validated in all clinical situations.     eGFR's persistently <90 mL/min signify possible Chronic Kidney     Disease.  TSH     Status: None   Collection Time    10/16/14  1:05 PM      Result Value Ref Range   TSH 0.806  0.350 - 4.500 uIU/mL  TROPONIN I     Status: Abnormal   Collection Time    10/16/14  1:05 PM      Result Value Ref Range   Troponin I >20.00 (*) <0.30 ng/mL   Comment:  Due to the release kinetics of cTnI,     a negative result within the first hours     of the onset of symptoms does not rule out     myocardial infarction with certainty.     If myocardial infarction is still suspected,     repeat the test at appropriate intervals.     CRITICAL RESULT CALLED TO, READ BACK BY AND VERIFIED WITH:     CULLUMS,R RN 10/16/14 1408 [address] A1C     Status: Abnormal   Collection Time    10/16/14  1:05 PM      Result Value Ref Range   Hemoglobin A1C 6.3 (*) <5.7 %   Comment: (NOTE)                                                                               According to the ADA Clinical Practice Recommendations for 2011, when     HbA1c is used as a screening test:      >=6.5%   Diagnostic of Diabetes Mellitus               (if abnormal result is confirmed)     5.7-6.4%   Increased risk of developing Diabetes Mellitus     References:Diagnosis and Classification of Diabetes Mellitus,Diabetes     JFHL,4562,56(LSLHT 1):S62-S69 and Standards of Medical Care in             Diabetes - 2011,Diabetes Care,2011,34 (Suppl 1):S11-S61.   Mean Plasma Glucose 134 (*) <117 mg/dL   Comment:  Performed at [facility]  TROPONIN I     Status: Abnormal   Collection Time    10/16/14  6:13 PM      Result Value Ref Range   Troponin I >20.00 (*) <0.30 ng/mL   Comment:            Due to the release kinetics of cTnI,     a negative result within the first hours     of the onset of symptoms does not rule out     myocardial infarction with certainty.     If myocardial infarction is still suspected,     repeat the test at appropriate intervals.     CRITICAL VALUE NOTED.  VALUE IS CONSISTENT WITH PREVIOUSLY REPORTED AND CALLED VALUE.  TROPONIN I     Status: Abnormal   Collection Time    10/16/14 11:00 PM      Result Value Ref Range   Troponin I >20.00 (*) <0.30 ng/mL   Comment:            Due to the release kinetics of cTnI,     a negative result within the first hours     of the onset of symptoms does not rule out     myocardial infarction with certainty.     If myocardial infarction is still suspected,     repeat the test at appropriate intervals.     CRITICAL VALUE NOTED.  VALUE IS CONSISTENT WITH PREVIOUSLY REPORTED AND CALLED VALUE.  COMPREHENSIVE METABOLIC PANEL     Status: Abnormal   Collection Time    10/17/14  2:23 AM  Result Value Ref Range   Sodium 141  137 - 147 mEq/L   Potassium 4.3  3.7 - 5.3 mEq/L   Chloride 104  96 - 112 mEq/L   CO2 24  19 - 32 mEq/L   Glucose, Bld 151 (*) 70 - 99 mg/dL   BUN 13  6 - 23 mg/dL   Creatinine, Ser 1.00  0.50 - 1.35 mg/dL   Calcium 8.9  8.4 - 10.5 mg/dL   Total Protein 6.6  6.0 - 8.3 g/dL   Albumin 3.4 (*) 3.5 - 5.2 g/dL   AST 291 (*) 0 - 37 U/L   ALT 64 (*) 0 - 53 U/L   Alkaline Phosphatase 73  39 - 117 U/L   Total Bilirubin 0.6  0.3 - 1.2 mg/dL   GFR calc non Af Amer 74 (*) >90 mL/min   GFR calc Af Amer 86 (*) >90 mL/min   Comment: (NOTE)     The eGFR has been calculated using the CKD EPI equation.     This calculation has not been validated in all clinical situations.     eGFR's persistently <90 mL/min  signify possible Chronic Kidney     Disease.   Anion gap 13  5 - 15  CBC     Status: Abnormal   Collection Time    10/17/14  2:23 AM      Result Value Ref Range   WBC 15.6 (*) 4.0 - 10.5 K/uL   RBC 4.60  4.22 - 5.81 MIL/uL   Hemoglobin 14.5  13.0 - 17.0 g/dL   HCT 42.1  39.0 - 52.0 %   MCV 91.5  78.0 - 100.0 fL   MCH 31.5  26.0 - 34.0 pg   MCHC 34.4  30.0 - 36.0 g/dL   RDW 13.9  11.5 - 15.5 %   Platelets 223  150 - 400 K/uL  LIPID PANEL     Status: Abnormal   Collection Time    10/17/14  2:23 AM      Result Value Ref Range   Cholesterol 192  0 - 200 mg/dL   Triglycerides 188 (*) <150 mg/dL   HDL 26 (*) >39 mg/dL   Total CHOL/HDL Ratio 7.4     VLDL 38  0 - 40 mg/dL   LDL Cholesterol 128 (*) 0 - 99 mg/dL   Comment:            Total Cholesterol/HDL:CHD Risk     Coronary Heart Disease Risk Table                         Men   Women      1/2 Average Risk   3.4   3.3      Average Risk       5.0   4.4      2 X Average Risk   9.6   7.1      3 X Average Risk  23.4   11.0                Use the calculated Patient Ratio     above and the CHD Risk Table     to determine the patient's CHD Risk.                ATP III CLASSIFICATION (LDL):      <100     mg/dL   Optimal      100-129  mg/dL  Near or Above                        Optimal      130-159  mg/dL   Borderline      160-189  mg/dL   High      >190     mg/dL   Very High  PRO B NATRIURETIC PEPTIDE     Status: Abnormal   Collection Time    10/17/14  2:23 AM      Result Value Ref Range   Pro B Natriuretic peptide (BNP) 3298.0 (*) 0 - 125 pg/mL    Imaging: Imaging results have been reviewed  Tele: NSR  Assessment/Plan:   1. Active Problems: 2.   Acute ST elevation myocardial infarction (STEMI) involving left anterior descending coronary artery 3.   ST elevation MI (STEMI) 4. Acute systolic CHF  Time Spent Directly with Patient:  20 minutes  Length of Stay:  LOS: 2 days   Day # 2 Ant STEMI Rx with PCI/Stent  using BMS secondary to medication non compliance. On DAPT with plavix secondary to cost. Trop > 20. He had mod LV dysf with EF 40% and Ant WMA. He C/O orthopnea. On lasix 40 mg daily.BNP elevated to 3300. Will start IV lasix BID. Plan staged LCX/RCA intervention with Dr. Burt Knack. I have reviewed the cath films with him.  BERRY,JONATHAN J 10/18/2014, 11:30 AM

## 2014-10-19 ENCOUNTER — Encounter (HOSPITAL_COMMUNITY)
Admission: EM | Disposition: A | Discharge status: Home / Self Care | Payer: Self-pay | Source: Other Acute Inpatient Hospital | Attending: Cardiovascular Disease

## 2014-10-19 DIAGNOSIS — R0602 Shortness of breath: Secondary | ICD-10-CM

## 2014-10-19 DIAGNOSIS — I2510 Atherosclerotic heart disease of native coronary artery without angina pectoris: Secondary | ICD-10-CM

## 2014-10-19 HISTORY — PX: CARDIAC CATHETERIZATION: SHX172

## 2014-10-19 HISTORY — PX: PERCUTANEOUS CORONARY STENT INTERVENTION (PCI-S): SHX5485

## 2014-10-19 LAB — GLUCOSE, CAPILLARY
GLUCOSE-CAPILLARY: 128 mg/dL — AB (ref 70–99)
Glucose-Capillary: 118 mg/dL — ABNORMAL HIGH (ref 70–99)

## 2014-10-19 LAB — BASIC METABOLIC PANEL
ANION GAP: 13 (ref 5–15)
BUN: 19 mg/dL (ref 6–23)
CALCIUM: 8.8 mg/dL (ref 8.4–10.5)
CO2: 27 mEq/L (ref 19–32)
Chloride: 99 mEq/L (ref 96–112)
Creatinine, Ser: 0.97 mg/dL (ref 0.50–1.35)
GFR, EST NON AFRICAN AMERICAN: 82 mL/min — AB (ref >90)
Glucose, Bld: 101 mg/dL — ABNORMAL HIGH (ref 70–99)
POTASSIUM: 3.7 meq/L (ref 3.7–5.3)
Sodium: 139 mEq/L (ref 137–147)

## 2014-10-19 LAB — POCT ACTIVATED CLOTTING TIME: Activated Clotting Time: 394 seconds

## 2014-10-19 SURGERY — PERCUTANEOUS CORONARY STENT INTERVENTION (PCI-S)
Anesthesia: LOCAL

## 2014-10-19 MED ORDER — LIDOCAINE HCL (PF) 1 % IJ SOLN
INTRAMUSCULAR | Status: AC
  Filled 2014-10-19: qty 30

## 2014-10-19 MED ORDER — NITROGLYCERIN 1 MG/10 ML FOR IR/CATH LAB
INTRA_ARTERIAL | Status: AC
  Filled 2014-10-19: qty 10

## 2014-10-19 MED ORDER — SODIUM CHLORIDE 0.9 % IV SOLN
0.2500 mg/kg/h | INTRAVENOUS | Status: DC
  Filled 2014-10-19: qty 250

## 2014-10-19 MED ORDER — SODIUM CHLORIDE 0.9 % IV SOLN: 250.0000 mL | INTRAVENOUS | Status: DC | PRN

## 2014-10-19 MED ORDER — SODIUM CHLORIDE 0.9 % IV SOLN
INTRAVENOUS | Status: AC
  Administered 2014-10-19: 75 mL/h via INTRAVENOUS

## 2014-10-19 MED ORDER — SODIUM CHLORIDE 0.9 % IJ SOLN
3.0000 mL | Freq: Two times a day (BID) | INTRAMUSCULAR | Status: DC
  Administered 2014-10-19 – 2014-10-20 (×3): 3 mL via INTRAVENOUS

## 2014-10-19 MED ORDER — VERAPAMIL HCL 2.5 MG/ML IV SOLN
INTRAVENOUS | Status: AC
  Filled 2014-10-19: qty 2

## 2014-10-19 MED ORDER — FENTANYL CITRATE 0.05 MG/ML IJ SOLN
INTRAMUSCULAR | Status: AC
  Filled 2014-10-19: qty 2

## 2014-10-19 MED ORDER — POTASSIUM CHLORIDE CRYS ER 20 MEQ PO TBCR
40.0000 meq | EXTENDED_RELEASE_TABLET | Freq: Once | ORAL | Status: AC
  Administered 2014-10-19: 40 meq via ORAL
  Filled 2014-10-19: qty 2

## 2014-10-19 MED ORDER — HEPARIN (PORCINE) IN NACL 2-0.9 UNIT/ML-% IJ SOLN
INTRAMUSCULAR | Status: AC
  Filled 2014-10-19: qty 1000

## 2014-10-19 MED ORDER — MIDAZOLAM HCL 2 MG/2ML IJ SOLN
INTRAMUSCULAR | Status: AC
  Filled 2014-10-19: qty 2

## 2014-10-19 MED ORDER — SODIUM CHLORIDE 0.9 % IJ SOLN: 3.0000 mL | INTRAMUSCULAR | Status: DC | PRN

## 2014-10-19 MED ORDER — SODIUM CHLORIDE 0.9 % IV SOLN
INTRAVENOUS | Status: DC
  Administered 2014-10-19: 09:00:00 via INTRAVENOUS

## 2014-10-19 MED ORDER — CETYLPYRIDINIUM CHLORIDE 0.05 % MT LIQD
7.0000 mL | Freq: Two times a day (BID) | OROMUCOSAL | Status: DC
  Administered 2014-10-19 (×2): 7 mL via OROMUCOSAL

## 2014-10-19 MED ORDER — BIVALIRUDIN 250 MG IV SOLR
INTRAVENOUS | Status: AC
  Filled 2014-10-19: qty 250

## 2014-10-19 NOTE — Interval H&P Note (Signed)
History and Physical Interval Note:  10/19/2014 1:55 PM  Travis Gutierrez  has presented today for surgery, with the diagnosis of cad  The various methods of treatment have been discussed with the patient and family. After consideration of risks, benefits and other options for treatment, the patient has consented to  Procedure(s): PERCUTANEOUS CORONARY STENT INTERVENTION (PCI-S) (N/A) as a surgical intervention .  The patient's history has been reviewed, patient examined, no change in status, stable for surgery.  I have reviewed the patient's chart and labs.  Questions were answered to the patient's satisfaction.    Records reviewed. Films reviewed. Patient interviewed and evaluated. 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 Has critical stenosis of the LCx and RCA with occluded grafts to those territories. Plan multivessel PCI today. Reviewed risks, indications, and alternatives.   Cath Lab Visit (complete for each Cath Lab visit)  Clinical Evaluation Leading to the Procedure:   ACS: Yes.    Non-ACS:    Anginal Classification: CCS IV  Anti-ischemic medical therapy: Minimal Therapy (1 class of medications)  Non-Invasive Test Results: No non-invasive testing performed  Prior CABG: Previous CABG       Sherren Mocha

## 2014-10-19 NOTE — Progress Notes (Signed)
Subjective:  Had one episode of nitrate responsive CP last PM, no SOB  Objective:  Temp:  [97.7 F (36.5 C)-99.2 F (37.3 C)] 98.3 F (36.8 C) (10/28 0749) Pulse Rate:  [73-92] 86 (10/28 0912) Resp:  [16] 16 (10/28 0749) BP: (91-112)/(45-63) 112/63 mmHg (10/28 0912) SpO2:  [89 %-97 %] 96 % (10/28 0749) Weight:  [205 lb 11.2 oz (93.305 kg)] 205 lb 11.2 oz (93.305 kg) (10/28 0436) Weight change: -2 lb 3.1 oz (-0.995 kg)  Intake/Output from previous day: 10/27 0701 - 10/28 0700 In: 960 [P.O.:960] Out: 2550 [Urine:2550]  Intake/Output from this shift:    Physical Exam: General appearance: alert and no distress Neck: no adenopathy, no carotid bruit, no JVD, supple, symmetrical, trachea midline and thyroid not enlarged, symmetric, no tenderness/mass/nodules Lungs: clear to auscultation bilaterally Heart: regular rate and rhythm, S1, S2 normal, no murmur, click, rub or gallop Extremities: extremities normal, atraumatic, no cyanosis or edema  Lab Results: Results for orders placed during the hospital encounter of 10/16/14 (from the past 48 hour(s))  GLUCOSE, CAPILLARY     Status: Abnormal   Collection Time    10/18/14 11:57 AM      Result Value Ref Range   Glucose-Capillary 143 (*) 70 - 99 mg/dL  GLUCOSE, CAPILLARY     Status: Abnormal   Collection Time    10/18/14  4:37 PM      Result Value Ref Range   Glucose-Capillary 100 (*) 70 - 99 mg/dL   Comment 1 Capillary Sample    BASIC METABOLIC PANEL     Status: Abnormal   Collection Time    10/19/14  3:19 AM      Result Value Ref Range   Sodium 139  137 - 147 mEq/L   Potassium 3.7  3.7 - 5.3 mEq/L   Chloride 99  96 - 112 mEq/L   CO2 27  19 - 32 mEq/L   Glucose, Bld 101 (*) 70 - 99 mg/dL   BUN 19  6 - 23 mg/dL   Creatinine, Ser 0.97  0.50 - 1.35 mg/dL   Calcium 8.8  8.4 - 10.5 mg/dL   GFR calc non Af Amer 82 (*) >90 mL/min   GFR calc Af Amer >90  >90 mL/min   Comment: (NOTE)     The eGFR has been calculated using  the CKD EPI equation.     This calculation has not been validated in all clinical situations.     eGFR's persistently <90 mL/min signify possible Chronic Kidney     Disease.   Anion gap 13  5 - 15  GLUCOSE, CAPILLARY     Status: Abnormal   Collection Time    10/19/14  8:44 AM      Result Value Ref Range   Glucose-Capillary 118 (*) 70 - 99 mg/dL   Comment 1 Capillary Sample      Imaging:  Imaging results have been reviewed  Tele: NSR  Assessment/Plan:   1. Active Problems: 2.   Acute ST elevation myocardial infarction (STEMI) involving left anterior descending coronary artery 3.   ST elevation MI (STEMI) 4.   Time Spent Directly with Patient:  20 minutes  Length of Stay:  LOS: 3 days   Day #3 Anterior STEMI Rx with overlapping BMS to prox LAD (BMS secondary to medication non compliance). Residual LCX and RCA disease. Reviewed with Dr. Burt Knack. For staged intervention today.  He was c/o orthopnea. I added lasix 40 mg IV BID  with good diuresis. Labs stable. Will replete K. Switch back to oral lasix tomorrow. Home in next 24-48 hours.   Travis Gutierrez 10/19/2014, 10:25 AM

## 2014-10-19 NOTE — H&P (View-Only) (Signed)
Subjective:  Had one episode of nitrate responsive CP last PM, no SOB  Objective:  Temp:  [97.7 F (36.5 C)-99.2 F (37.3 C)] 98.3 F (36.8 C) (10/28 0749) Pulse Rate:  [73-92] 86 (10/28 0912) Resp:  [16] 16 (10/28 0749) BP: (91-112)/(45-63) 112/63 mmHg (10/28 0912) SpO2:  [89 %-97 %] 96 % (10/28 0749) Weight:  [205 lb 11.2 oz (93.305 kg)] 205 lb 11.2 oz (93.305 kg) (10/28 0436) Weight change: -2 lb 3.1 oz (-0.995 kg)  Intake/Output from previous day: 10/27 0701 - 10/28 0700 In: 960 [P.O.:960] Out: 2550 [Urine:2550]  Intake/Output from this shift:    Physical Exam: General appearance: alert and no distress Neck: no adenopathy, no carotid bruit, no JVD, supple, symmetrical, trachea midline and thyroid not enlarged, symmetric, no tenderness/mass/nodules Lungs: clear to auscultation bilaterally Heart: regular rate and rhythm, S1, S2 normal, no murmur, click, rub or gallop Extremities: extremities normal, atraumatic, no cyanosis or edema  Lab Results: Results for orders placed during the hospital encounter of 10/16/14 (from the past 48 hour(s))  GLUCOSE, CAPILLARY     Status: Abnormal   Collection Time    10/18/14 11:57 AM      Result Value Ref Range   Glucose-Capillary 143 (*) 70 - 99 mg/dL  GLUCOSE, CAPILLARY     Status: Abnormal   Collection Time    10/18/14  4:37 PM      Result Value Ref Range   Glucose-Capillary 100 (*) 70 - 99 mg/dL   Comment 1 Capillary Sample    BASIC METABOLIC PANEL     Status: Abnormal   Collection Time    10/19/14  3:19 AM      Result Value Ref Range   Sodium 139  137 - 147 mEq/L   Potassium 3.7  3.7 - 5.3 mEq/L   Chloride 99  96 - 112 mEq/L   CO2 27  19 - 32 mEq/L   Glucose, Bld 101 (*) 70 - 99 mg/dL   BUN 19  6 - 23 mg/dL   Creatinine, Ser 0.97  0.50 - 1.35 mg/dL   Calcium 8.8  8.4 - 10.5 mg/dL   GFR calc non Af Amer 82 (*) >90 mL/min   GFR calc Af Amer >90  >90 mL/min   Comment: (NOTE)     The eGFR has been calculated using  the CKD EPI equation.     This calculation has not been validated in all clinical situations.     eGFR's persistently <90 mL/min signify possible Chronic Kidney     Disease.   Anion gap 13  5 - 15  GLUCOSE, CAPILLARY     Status: Abnormal   Collection Time    10/19/14  8:44 AM      Result Value Ref Range   Glucose-Capillary 118 (*) 70 - 99 mg/dL   Comment 1 Capillary Sample      Imaging:  Imaging results have been reviewed  Tele: NSR  Assessment/Plan:   1. Active Problems: 2.   Acute ST elevation myocardial infarction (STEMI) involving left anterior descending coronary artery 3.   ST elevation MI (STEMI) 4.   Time Spent Directly with Patient:  20 minutes  Length of Stay:  LOS: 3 days   Day #3 Anterior STEMI Rx with overlapping BMS to prox LAD (BMS secondary to medication non compliance). Residual LCX and RCA disease. Reviewed with Dr. Burt Knack. For staged intervention today.  He was c/o orthopnea. I added lasix 40 mg IV BID  with good diuresis. Labs stable. Will replete K. Switch back to oral lasix tomorrow. Home in next 24-48 hours.   Travis Gutierrez 10/19/2014, 10:25 AM

## 2014-10-19 NOTE — Care Management Note (Signed)
    Page 1 of 1   10/19/2014     10:38:37 AM CARE MANAGEMENT NOTE 10/19/2014  Patient:  Travis Gutierrez, Travis Gutierrez   Account Number:  [id number]  Date Initiated:  10/19/2014  Documentation initiated by:  Elissa Hefty  Subjective/Objective Assessment:   adm w mi     Action/Plan:   lives w fam   Anticipated DC Date:     Anticipated DC Plan:           Choice offered to / List presented to:             Status of service:   Medicare Important Message given?  YES (If response is "NO", the following Medicare IM given date fields will be blank) Date Medicare IM given:  10/19/2014 Medicare IM given by:  Elissa Hefty Date Additional Medicare IM given:   Additional Medicare IM given by:    Discharge Disposition:  HOME/SELF CARE  Per UR Regulation:  Reviewed for med. necessity/level of care/duration of stay  If discussed at [place] of Stay Meetings, dates discussed:    Comments:

## 2014-10-19 NOTE — CV Procedure (Signed)
    CARDIAC CATH NOTE  Name: SAJJAD HONEA MRN: [id number] DOB: [birth year]  Procedure: PTCA and stenting of the RCA, PTCA and Stenting of the mid-circumflex  Indication: Post-infarction anginal pain at rest. The patient presented with an Anterior STEMI this admission and was treated with Primary PCI of the proximal LAD. He is s/p CABG with occluded grafts to the LCx and RCA territories and presents for staged PCI of the native RCA and LCx.   Procedural Details: The right wrist was prepped, draped, and anesthetized with 1% lidocaine. Using the modified Seldinger technique, a 6 Fr sheath was introduced into the radial artery. 3 mg verapamil was administered through the radial sheath. Weight-based bivalirudin was given for anticoagulation. Attention was first turned to the native RCA. There is subtotal occlusion of the mid-vessel and severe disease extending back to the ostium. Once a therapeutic ACT was achieved, a 6 [place] JR-4 guide catheter was inserted.  A Cougar coronary guidewire was used to cross the lesion.  The lesion was predilated with a 2.5x20 mm balloon.  The lesion was then stented with a 3.5x38 mm Promus DES stent.  This covered the entire segment of disease. The stent was postdilated with a 3.75 mm noncompliant balloon extending back to the ostium.  Following PCI, there was 0% residual stenosis and TIMI-3 flow.   Attention was then turned to the LCx. An XB-LAD guide catheter was used. A cougar wire was advanced into the distal Lcx beyond an area of 90% stenosis in the mid-vessel. I tried to advance a second cougar wire into the second OM where the vessel was subtotally occluded. The wire wouldn't cross. I then made an attempt with a Fielder XT and couldn't get it to cross through the distal cap of the occlusion. I decided to abort efforts at the OM PCI as the vessel is small to medium in caliber and reacts like a chronic occlusion. The mid-LCx was dilated with a 2.5 mm balloon and  stented with a 4.0 x 24 Promus DES. The stent was post-dilated with a 4.5 mm [place] balloon to 16 atm. Final angiography confirmed an excellent result. The patient tolerated the procedure well. There were no immediate procedural complications. A TR band was used for radial hemostasis. The patient was transferred to the post catheterization recovery area for further monitoring.  Lesion Data: Lesion 1 Vessel: RCA/proximal Percent stenosis (pre): 99 TIMI-flow (pre):  3 Stent:  3.5x38 mm Promus DES Percent stenosis (post): 0 TIMI-flow (post): 3  Lesion 2 Vessel: LCx/mid Percent stenosis (pre): 90 TIMI-flow (pre):  3 Stent:  4.0x24 mm Promus DES Percent stenosis (post): 0 TIMI-flow (post): 3  Lesion 3 Vessel: OM 2 Percent stenosis (pre): 100 TIMI-flow (pre):  3 Stent:  none Percent stenosis (post): 100 TIMI-flow (post): 3 (via collaterals)  Estimated Blood Loss: minimal  Conclusions:  1. Successful PCI of the RCA and LCx with DES platforms 2. Unsuccessful attempt at PCI of the OM (unable to cross lesion)  Recommendations: ongoing medical therapy. At least 12 months of DAPT and long-term Rx if tolerated.   Sherren Mocha MD, [facility] 10/19/2014, 3:22 PM

## 2014-10-19 NOTE — Progress Notes (Addendum)
0822  (BP 127/70)Pt given 1 NTG tab for chest pressure across chest 4/10 pain, and pain in left elbow, pt also c/o feeling clammy. Skin slightly moist and warm.   Chest pain slowly decreased after 1 NTG tab,  0830 pain/pressure slightly there in chest , "maybe a 1" pt states. No pain in elbow. Wanted to get back up to chair, instructed to stay in bed and rest for now.   0838 denies any pain 0/10. Resting in bed

## 2014-10-20 ENCOUNTER — Encounter (HOSPITAL_COMMUNITY): Payer: Self-pay | Admitting: Physician Assistant

## 2014-10-20 ENCOUNTER — Other Ambulatory Visit: Payer: Self-pay | Admitting: Physician Assistant

## 2014-10-20 ENCOUNTER — Other Ambulatory Visit: Payer: Self-pay

## 2014-10-20 DIAGNOSIS — I5021 Acute systolic (congestive) heart failure: Secondary | ICD-10-CM

## 2014-10-20 DIAGNOSIS — R7303 Prediabetes: Secondary | ICD-10-CM | POA: Diagnosis present

## 2014-10-20 DIAGNOSIS — I5032 Chronic diastolic (congestive) heart failure: Secondary | ICD-10-CM

## 2014-10-20 DIAGNOSIS — Z720 Tobacco use: Secondary | ICD-10-CM

## 2014-10-20 DIAGNOSIS — I2510 Atherosclerotic heart disease of native coronary artery without angina pectoris: Secondary | ICD-10-CM

## 2014-10-20 DIAGNOSIS — I255 Ischemic cardiomyopathy: Secondary | ICD-10-CM | POA: Diagnosis present

## 2014-10-20 DIAGNOSIS — E785 Hyperlipidemia, unspecified: Secondary | ICD-10-CM

## 2014-10-20 LAB — CBC
HCT: 42.8 % (ref 39.0–52.0)
Hemoglobin: 15.1 g/dL (ref 13.0–17.0)
MCH: 31.8 pg (ref 26.0–34.0)
MCHC: 35.3 g/dL (ref 30.0–36.0)
MCV: 90.1 fL (ref 78.0–100.0)
PLATELETS: 242 10*3/uL (ref 150–400)
RBC: 4.75 MIL/uL (ref 4.22–5.81)
RDW: 13.5 % (ref 11.5–15.5)
WBC: 13.2 10*3/uL — ABNORMAL HIGH (ref 4.0–10.5)

## 2014-10-20 LAB — BASIC METABOLIC PANEL
ANION GAP: 13 (ref 5–15)
BUN: 17 mg/dL (ref 6–23)
CO2: 27 mEq/L (ref 19–32)
Calcium: 9.1 mg/dL (ref 8.4–10.5)
Chloride: 97 mEq/L (ref 96–112)
Creatinine, Ser: 0.9 mg/dL (ref 0.50–1.35)
GFR calc Af Amer: >90 mL/min (ref >90)
GFR, EST NON AFRICAN AMERICAN: 84 mL/min — AB (ref >90)
Glucose, Bld: 123 mg/dL — ABNORMAL HIGH (ref 70–99)
Potassium: 3.6 mEq/L — ABNORMAL LOW (ref 3.7–5.3)
SODIUM: 137 meq/L (ref 137–147)

## 2014-10-20 LAB — GLUCOSE, CAPILLARY
Glucose-Capillary: 119 mg/dL — ABNORMAL HIGH (ref 70–99)
Glucose-Capillary: 153 mg/dL — ABNORMAL HIGH (ref 70–99)

## 2014-10-20 LAB — PRO B NATRIURETIC PEPTIDE: PRO B NATRI PEPTIDE: 6662 pg/mL — AB (ref 0–125)

## 2014-10-20 MED ORDER — FUROSEMIDE 40 MG PO TABS: 40.0000 mg | ORAL_TABLET | Freq: Every day | ORAL | Status: DC

## 2014-10-20 MED ORDER — POTASSIUM CHLORIDE CRYS ER 20 MEQ PO TBCR: 20.0000 meq | EXTENDED_RELEASE_TABLET | Freq: Every day | ORAL | Status: DC

## 2014-10-20 MED ORDER — FUROSEMIDE 40 MG PO TABS: 40.0000 mg | ORAL_TABLET | Freq: Every day | ORAL | Status: AC

## 2014-10-20 MED ORDER — ATORVASTATIN CALCIUM 80 MG PO TABS: 80.0000 mg | ORAL_TABLET | Freq: Every day | ORAL | Status: DC

## 2014-10-20 MED ORDER — ASPIRIN 81 MG PO TBEC: 81.0000 mg | DELAYED_RELEASE_TABLET | Freq: Every day | ORAL | Status: AC

## 2014-10-20 MED ORDER — NITROGLYCERIN 0.4 MG SL SUBL: 0.4000 mg | SUBLINGUAL_TABLET | SUBLINGUAL | Status: AC | PRN

## 2014-10-20 MED ORDER — CLOPIDOGREL BISULFATE 75 MG PO TABS: 75.0000 mg | ORAL_TABLET | Freq: Every day | ORAL | Status: AC

## 2014-10-20 MED ORDER — METOPROLOL TARTRATE 12.5 MG HALF TABLET: 12.5000 mg | ORAL_TABLET | Freq: Two times a day (BID) | ORAL | Status: DC

## 2014-10-20 MED ORDER — LISINOPRIL 2.5 MG PO TABS: 2.5000 mg | ORAL_TABLET | Freq: Every day | ORAL | Status: DC

## 2014-10-20 MED ORDER — POTASSIUM CHLORIDE CRYS ER 20 MEQ PO TBCR
40.0000 meq | EXTENDED_RELEASE_TABLET | Freq: Once | ORAL | Status: AC
  Administered 2014-10-20: 40 meq via ORAL
  Filled 2014-10-20: qty 2

## 2014-10-20 MED FILL — Sodium Chloride IV Soln 0.9%: INTRAVENOUS | Qty: 50 | Status: AC

## 2014-10-20 NOTE — Discharge Summary (Signed)
Discharge Summary   Patient ID: Travis Gutierrez,  MRN: [id number], DOB/AGE: December 07, 1946 68 y.o.  Admit date: 10/16/2014 Discharge date: 10/20/2014  Primary Care Provider: Default, Provider Primary Cardiologist: Prev seen by Dr. Stanford Breed in 2006; Lives in [place]. Seen by Dr. Gwenlyn Found during this admission  Discharge Diagnoses Principal Problem:   Acute ST elevation myocardial infarction (STEMI) involving left anterior descending coronary artery Active Problems:   Tobacco abuse   Hyperlipidemia   Ischemic cardiomyopathy   Chronic diastolic heart failure   Pre-diabetes   CAD, multiple vessel   Allergies No Known Allergies  Procedures  Echocardiogram 10/16/2014 LV EF: 35% - 40%  ------------------------------------------------------------------- Indications: MI - acute 410.91.  ------------------------------------------------------------------- History: PMH: Coronary artery disease. Risk factors: Current tobacco use.  ------------------------------------------------------------------- Study Conclusions  - Procedure narrative: Transthoracic echocardiography. Image quality was fair. The study was technically difficult, as a result of restricted patient mobility. - Left ventricle: The cavity size was normal. Wall thickness was normal. Systolic function was moderately reduced. The estimated ejection fraction was in the range of 35% to 40%. Doppler parameters are consistent with a reversible restrictive pattern, indicative of decreased left ventricular diastolic compliance and/or increased left atrial pressure (grade 3 diastolic dysfunction). Doppler parameters are consistent with high ventricular filling pressure. Medial E/e&' 28.5. - Regional wall motion abnormality: Akinesis of the entire anterior and basal-mid anteroseptal myocardium; cannot exclude akinesis of the basal-mid inferoseptal, apical septal, and apical myocardium.      Cardiac catheterization #1  10/16/2014 HEMODYNAMICS:  AO SYSTOLIC/AO DIASTOLIC: 350/09  LV SYSTOLIC/LV DIASTOLIC: 381/82   ANGIOGRAPHIC RESULTS:  1. Left main; normal  2. LAD; 90% proximal, occluded after the first septal perforator  3. Left circumflex; codominant with a moderate sized ramus branch, occluded first OM, high-grade tandem lesions in the second OM, 90% segmental mid-AV groove followed by 6070% distal AV groove and 75% PLA branch stenosis.  4. Right coronary artery; codominant with a 99% proximal stenosis with damping, proximal 7580% segmental stenosis, tandem 80 and 90% stenoses in the midportion  5.LIMA TO LAD; patent the slow flow after the anastomosis  6. SVG TO RCA was occluded at the origin  SVG TO circumflex marginal branches was occluded at the origin  SVG TO vital branch was widely patent  7. Left ventriculography; RAO left ventriculogram was performed using  25 mL of Visipaque dye at 12 mL/second. The overall LVEF estimated  30-35% % With wall motion abnormalities notable for severe anteroapical hypokinesia   IMPRESSION:Travis Gutierrez is approximately 4 hours into an acute anterior wall myocardial infarction. He is now 9 years post bypass grafting. His proximal LAD is occluded which most likely represents his culprit vessel although his LIMA is functionally occluded as well. He also has occluded grafts to the circumflex and RCA with high-grade native coronary artery disease. We will proceed with attempt at re-re-recanalization of his culprit vessel was staged intervention on his remaining 2 coronary arteries.  Overall impression: Successful PCI and stenting of an occluded proximal LAD in the setting of an anterior STEMI with bare-metal stents because of medication noncompliance. The patient does have severe LV dysfunction and may be a candidate for a Life Vest prior to the determination of recovery of LV function and the decision to place an ICD for primary prevention. He does have residual CAD in a  codominant circumflex, marginal branch and codominant RCA which will need to be addressed in a staged fashion during this hospitalization. The sheath was secured in place. He did  have AIVR , reperfusion arrhythmia, and was given an amiodarone bolus and infusion. He left the cath lab in stable condition with blood pressures in the systolic equals 956 mmHg range    Cardiac catheterization #2 10/19/2014 Lesion Data:  Lesion 1  Vessel: RCA/proximal  Percent stenosis (pre): 99  TIMI-flow (pre): 3  Stent: 3.5x38 mm Promus DES  Percent stenosis (post): 0  TIMI-flow (post): 3  Lesion 2  Vessel: LCx/mid  Percent stenosis (pre): 90  TIMI-flow (pre): 3  Stent: 4.0x24 mm Promus DES  Percent stenosis (post): 0  TIMI-flow (post): 3  Lesion 3  Vessel: OM 2  Percent stenosis (pre): 100  TIMI-flow (pre): 3  Stent: none  Percent stenosis (post): 100  TIMI-flow (post): 3 (via collaterals)  Estimated Blood Loss: minimal  Conclusions:  1. Successful PCI of the RCA and LCx with DES platforms  2. Unsuccessful attempt at PCI of the OM (unable to cross lesion)  Recommendations: ongoing medical therapy. At least 12 months of DAPT and long-term Rx if tolerated.        Hospital Course  The patient is a 68 year old Caucasian male with past medical history of tobacco abuse and history of remote triple-vessel disease status post CABG in 2006. He was subsequently lost to follow-up with his PCP and cardiologist since then. He was brought by [facility] to [facility] on 10/16/2014 with anterior STEMI after developing chest pain around 4 AM. On arrival his blood pressure was stable at 138/68. He was transferred to the Cath Lab for urgent cardiac catheterization which revealed 90% proximal LAD and total occlusion after the first septal perforator, 90% segmental mid-AV groove left circumflex stenosis followed by 60-70 distal stenosis, 99% proximal RCA stenosis followed by 75-80% proximal RCA stenosis  and 80-90% mid RCA stenosis. Patent LIMA to LAD and SVG to vital branch, occluded SVG to RCA and SVG to left circumflex marginal branch. EF was noted to be 30-35% with severe anteroapical hypo-kinesis. Proximal LAD stenosis was treated with 2 BMS given history of medication noncompliance. The residual coronary disease in codominant left circumflex, marginal branch in codominant RCA was planned to be addressed in a staged fashion. Echocardiogram was obtained on 10/16/2014 which showed EF 38-75%, grade 3 diastolic dysfunction, akinesis of the entire anterior, basal mid anteroseptal myocardium. Post cardiac catheterization, patient was transferred to ICU to allow recovery and he was placed on aspirin and Plavix. Lipid function test obtained on 10/26 showed cholesterol 192, triglyceride 188, LDL 128 and HDL 26. She was placed on high-dose Lipitor. His troponin continued to be elevated greater than 20 post cath. ProBNP was obtained on 10/26 which was elevated to 3298. There were some leukocytosis after cardiac catheterization, however quickly trended back down. Given the elevated proBNP, he was given IV Lasix which was eventually transitioned to by mouth Lasix.   He underwent a scheduled repeat cardiac catheterization on 10/19/2014. The 99% proximal RCA was treated with drug-eluting stent. 90% mid left circumflex stenosis was also treated with drug-eluting stent. The original plan was to perform PCI on the 100% occluded OM 2 as well, however was unable to cross the lesion during cardiac cath. He was seen the morning of 10/20/2014, at which time he denies any significant chest pain or shortness breath, he is deemed stable for discharge from cardiology perspective. I have scheduled followup at our [place] office. Patient will also need 1 week BMET after starting PO lasix   Discharge Vitals Blood pressure 112/53, pulse 100, temperature  98.1 F (36.7 C), temperature source Oral, resp. rate 24, height 6' (1.829 m),  weight 188 lb (85.276 kg), SpO2 98.00%.  Filed Weights   10/18/14 0609 10/19/14 0436 10/20/14 0404  Weight: 207 lb 14.3 oz (94.3 kg) 205 lb 11.2 oz (93.305 kg) 188 lb (85.276 kg)    Labs  CBC  Recent Labs  10/20/14 0312  WBC 13.2*  HGB 15.1  HCT 42.8  MCV 90.1  PLT 664   Basic Metabolic Panel  Recent Labs  10/19/14 0319 10/20/14 0312  NA 139 137  K 3.7 3.6*  CL 99 97  CO2 27 27  GLUCOSE 101* 123*  BUN 19 17  CREATININE 0.97 0.90  CALCIUM 8.8 9.1    Disposition  Pt is being discharged home today in good condition.  Follow-up Plans & Appointments      Follow-up Information   Follow up with Rozann Lesches, MD On 11/11/2014. (3:40pm)    Specialty:  Cardiology   Contact information:   [address] [place] [address] [phone]       Please follow up. (1 week BMET lab at [facility] across the street from our [place] office)       Discharge Medications    Medication List         aspirin 81 MG EC tablet  Take 1 tablet (81 mg total) by mouth daily.     atorvastatin 80 MG tablet  Commonly known as:  LIPITOR  Take 1 tablet (80 mg total) by mouth daily at 6 PM.     clopidogrel 75 MG tablet  Commonly known as:  PLAVIX  Take 1 tablet (75 mg total) by mouth daily with breakfast.     furosemide 40 MG tablet  Commonly known as:  LASIX  Take 1 tablet (40 mg total) by mouth daily.  Start taking on:  10/21/2014     lisinopril 2.5 MG tablet  Commonly known as:  PRINIVIL,ZESTRIL  Take 1 tablet (2.5 mg total) by mouth daily.     metoprolol tartrate 12.5 mg Tabs tablet  Commonly known as:  LOPRESSOR  Take 0.5 tablets (12.5 mg total) by mouth 2 (two) times daily.     nitroGLYCERIN 0.4 MG SL tablet  Commonly known as:  NITROSTAT  Place 1 tablet (0.4 mg total) under the tongue every 5 (five) minutes x 3 doses as needed for chest pain.     potassium chloride SA 20 MEQ tablet  Commonly known as:  K-DUR,KLOR-CON  Take 1 tablet (20 mEq total)  by mouth daily.  Start taking on:  10/21/2014        Outstanding Labs/Studies  1 week BMET lab at [place], across the street from [place] office at [employer] of Discharge Encounter   Greater than 30 minutes including physician time.  Hilbert Corrigan PA-C Pager: [phone] 10/20/2014, 12:30 PM

## 2014-10-20 NOTE — Progress Notes (Signed)
Subjective:  No CP/SOB  POD # 4 Ant STEMI Rx with BMS to LAD with staged LCX and RCA intervention yesterday  Objective:  Temp:  [97.4 F (36.3 C)-98.2 F (36.8 C)] 97.9 F (36.6 C) (10/29 0720) Pulse Rate:  [76-99] 87 (10/29 0720) Resp:  [12-19] 12 (10/29 0720) BP: (85-133)/(43-73) 111/63 mmHg (10/29 0720) SpO2:  [95 %-99 %] 96 % (10/29 0720) Weight:  [188 lb (85.276 kg)] 188 lb (85.276 kg) (10/29 0404) Weight change: -17 lb 11.2 oz (-8.029 kg)  Intake/Output from previous day: 10/28 0701 - 10/29 0700 In: 1140 [P.O.:90; I.V.:1050] Out: 525 [Urine:525]  Intake/Output from this shift: Total I/O In: 50 [I.V.:50] Out: -   Physical Exam: General appearance: alert and no distress Neck: no adenopathy, no carotid bruit, no JVD, supple, symmetrical, trachea midline and thyroid not enlarged, symmetric, no tenderness/mass/nodules Lungs: clear to auscultation bilaterally Heart: regular rate and rhythm, S1, S2 normal, no murmur, click, rub or gallop Extremities: extremities normal, atraumatic, no cyanosis or edema  Lab Results: Results for orders placed during the hospital encounter of 10/16/14 (from the past 48 hour(s))  GLUCOSE, CAPILLARY     Status: Abnormal   Collection Time    10/18/14 11:57 AM      Result Value Ref Range   Glucose-Capillary 143 (*) 70 - 99 mg/dL  GLUCOSE, CAPILLARY     Status: Abnormal   Collection Time    10/18/14  4:37 PM      Result Value Ref Range   Glucose-Capillary 100 (*) 70 - 99 mg/dL   Comment 1 Capillary Sample    BASIC METABOLIC PANEL     Status: Abnormal   Collection Time    10/19/14  3:19 AM      Result Value Ref Range   Sodium 139  137 - 147 mEq/L   Potassium 3.7  3.7 - 5.3 mEq/L   Chloride 99  96 - 112 mEq/L   CO2 27  19 - 32 mEq/L   Glucose, Bld 101 (*) 70 - 99 mg/dL   BUN 19  6 - 23 mg/dL   Creatinine, Ser 0.97  0.50 - 1.35 mg/dL   Calcium 8.8  8.4 - 10.5 mg/dL   GFR calc non Af Amer 82 (*) >90 mL/min   GFR calc Af Amer >90   >90 mL/min   Comment: (NOTE)     The eGFR has been calculated using the CKD EPI equation.     This calculation has not been validated in all clinical situations.     eGFR's persistently <90 mL/min signify possible Chronic Kidney     Disease.   Anion gap 13  5 - 15  GLUCOSE, CAPILLARY     Status: Abnormal   Collection Time    10/19/14  8:44 AM      Result Value Ref Range   Glucose-Capillary 118 (*) 70 - 99 mg/dL   Comment 1 Capillary Sample    GLUCOSE, CAPILLARY     Status: Abnormal   Collection Time    10/19/14 11:49 AM      Result Value Ref Range   Glucose-Capillary 128 (*) 70 - 99 mg/dL   Comment 1 Capillary Sample    POCT ACTIVATED CLOTTING TIME     Status: None   Collection Time    10/19/14  2:16 PM      Result Value Ref Range   Activated Clotting Time 412    BASIC METABOLIC PANEL     Status:  Abnormal   Collection Time    10/20/14  3:12 AM      Result Value Ref Range   Sodium 137  137 - 147 mEq/L   Potassium 3.6 (*) 3.7 - 5.3 mEq/L   Chloride 97  96 - 112 mEq/L   CO2 27  19 - 32 mEq/L   Glucose, Bld 123 (*) 70 - 99 mg/dL   BUN 17  6 - 23 mg/dL   Creatinine, Ser 0.90  0.50 - 1.35 mg/dL   Calcium 9.1  8.4 - 10.5 mg/dL   GFR calc non Af Amer 84 (*) >90 mL/min   GFR calc Af Amer >90  >90 mL/min   Comment: (NOTE)     The eGFR has been calculated using the CKD EPI equation.     This calculation has not been validated in all clinical situations.     eGFR's persistently <90 mL/min signify possible Chronic Kidney     Disease.   Anion gap 13  5 - 15  CBC     Status: Abnormal   Collection Time    10/20/14  3:12 AM      Result Value Ref Range   WBC 13.2 (*) 4.0 - 10.5 K/uL   RBC 4.75  4.22 - 5.81 MIL/uL   Hemoglobin 15.1  13.0 - 17.0 g/dL   HCT 42.8  39.0 - 52.0 %   MCV 90.1  78.0 - 100.0 fL   MCH 31.8  26.0 - 34.0 pg   MCHC 35.3  30.0 - 36.0 g/dL   RDW 13.5  11.5 - 15.5 %   Platelets 242  150 - 400 K/uL  GLUCOSE, CAPILLARY     Status: Abnormal   Collection Time     10/20/14  7:25 AM      Result Value Ref Range   Glucose-Capillary 119 (*) 70 - 99 mg/dL    Imaging: Imaging results have been reviewed  Assessment/Plan:   1. Active Problems: 2.   Acute ST elevation myocardial infarction (STEMI) involving left anterior descending coronary artery 3.   ST elevation MI (STEMI) 4.   Time Spent Directly with Patient:  20 minutes  Length of Stay:  LOS: 4 days   POD #4 Ant STEMI Rx with BMSs to prox LAD. EF 35-40% by 2D. Stages LCX and RCA intervention by Dr Burt Knack yesterday (unable to cross OM2, CTO). No CP. Exam benign. Will change IV to PO lasix. OK for DC today. ROV 2-3 weeks. Will need [facility].  Lorretta Harp 10/20/2014, 9:58 AM

## 2014-10-20 NOTE — Progress Notes (Signed)
CARDIAC REHAB PHASE I   PRE:  Rate/Rhythm: 92 SR  BP:  Supine:   Sitting: 122/60  Standing:    SaO2:   MODE:  Ambulation: 700 ft   POST:  Rate/Rhythm: 110 ST  BP:  Supine:   Sitting: 112/53  Standing:    SaO2: 95%RA 1026-1118 Pt walked 700 ft with steady gait. On RA. Tolerated well with no CP. Stressed to pt importance of taking plavix every day as pt does not like to take medication. Encouraged him to put pills in a 7 day pill box so he will not miss doses as he stated he was only taking an aspirin every once in a while. Encouraged smoking cessation and pt plans to use cold [place]. Encouraged to watch salt in foods. Pt has scales at home and stated he would weigh self when he can. Told pt if he gains 5 lbs in week or have swelling in feet/ankles to let cardiologist know. Do not think pt will weigh daily. Offered CRP 2 but pt not interested. Gave walking instructions. Pt stated he takes care of sister and hard to get away to go to Phase 2 program. Reviewed NTG use. Stressed importance of taking care of self.   Graylon Good, RN BSN  10/20/2014 11:14 AM

## 2014-10-20 NOTE — Discharge Instructions (Signed)
Acute Coronary Syndrome  Acute coronary syndrome (ACS) is an urgent problem in which the blood and oxygen supply to the heart is critically deficient. ACS requires hospitalization because one or more coronary arteries may be blocked.  ACS represents a range of conditions including:  · Previous angina that is now unstable, lasts longer, happens at rest, or is more intense.  · A heart attack, with heart muscle cell injury and death.  There are three vital coronary arteries that supply the heart muscle with blood and oxygen so that it can pump blood effectively. If blockages to these arteries develop, blood flow to the heart muscle is reduced. If the heart does not get enough blood, angina may occur as the first warning sign.  SYMPTOMS   · The most common signs of angina include:  ¨ Tightness or squeezing in the chest.  ¨ Feeling of heaviness on the chest.  ¨ Discomfort in the arms, neck, back, or jaw.  ¨ Shortness of breath and nausea.  ¨ Cold, wet skin.  · Angina is usually brought on by physical effort or excitement which increase the oxygen needs of the heart. These states increase the blood flow needs of the heart beyond what can be delivered.  · Other symptoms that are not as common include:  ¨ Fatigue  ¨ Unexplained feelings of nervousness or anxiety  ¨ Weakness  ¨ Diarrhea  · Sometimes, you may not have noticed any symptoms at all but still suffered a cardiac injury.  TREATMENT   · Medicines to help discomfort may include nitroglycerin (nitro) in the form of tablets or a spray for rapid relief, or longer-acting forms such as cream, patches, or capsules. (Be aware that there are many side effects and possible interactions with other drugs).  · Other medicines may be used to help the heart pump better.  · Procedures to open blocked arteries including angioplasty or stent placement to keep the arteries open.  · Open heart surgery may be needed when there are many blockages or they are in critical locations that  are best treated with surgery.  HOME CARE INSTRUCTIONS   · Do not use any tobacco products including cigarettes, chewing tobacco, or electronic cigarettes.  · Take one baby or adult aspirin daily, if your health care provider advises. This helps reduce the risk of a heart attack.  · It is very important that you follow the angina treatment prescribed by your health care provider. Make arrangements for proper follow-up care.  · Eat a heart healthy diet with salt and fat restrictions as advised.  · Regular exercise is good for you as long as it does not cause discomfort. Do not begin any new type of exercise until you check with your health care provider.  · If you are overweight, you should lose weight.  · Try to maintain normal blood lipid levels.  · Keep your blood pressure under control as recommended by your health care provider.  · You should tell your health care provider right away about any increase in the severity or frequency of your chest discomfort or angina attacks. When you have angina, you should stop what you are doing and sit down. This may bring relief in 3 to 5 minutes. If your health care provider has prescribed nitro, take it as directed.  · If your health care provider has given you a follow-up appointment, it is very important to keep that appointment. Not keeping the appointment could result in a chronic or   of breath. °· You feel faint, lightheaded, or pass out. °· Your chest discomfort gets worse. °· You are sweating or experience sudden profound fatigue. °· You do not get relief of your chest pain after 3 doses of nitro. °· Your discomfort lasts longer than 15 minutes. °MAKE SURE YOU:  °· Understand these instructions. °· Will watch your condition. °· Will get help right  away if you are not doing well or get worse. °· Take all medicines as directed by your health care provider. °Document Released: 12/09/2005 Document Revised: 12/14/2013 Document Reviewed: 04/12/2014 °Exit[employer]® Patient Information ©2015 [employer]. This information is not intended to replace advice given to you by your health care provider. Make sure you discuss any questions you have with your health care provider. ° °No driving for 24hours. No lifting over 5 lbs for 1 week. No sexual activity for 1 week. Keep procedure site clean & dry. If you notice increased pain, swelling, bleeding or pus, call/return!  You may shower, but no soaking baths/hot tubs/pools for 1 week.  ° ° °

## 2014-10-20 NOTE — Progress Notes (Signed)
DC orders received.  Patient stable with no S/S of distress.  Medication, radial site care and discharge information reviewed with patient.  Patient DC home with family. Parker, Ardeth Sportsman

## 2014-10-21 NOTE — Discharge Summary (Signed)
Travis Gutierrez, M.D., [place], [facility], [place] [address] [place], [place]  [address]  [phone] 10/21/2014 11:25 AM

## 2014-11-11 ENCOUNTER — Ambulatory Visit (INDEPENDENT_AMBULATORY_CARE_PROVIDER_SITE_OTHER): Payer: Medicare Other | Admitting: Cardiology

## 2014-11-11 ENCOUNTER — Encounter: Payer: Self-pay | Admitting: Cardiology

## 2014-11-11 DIAGNOSIS — I255 Ischemic cardiomyopathy: Secondary | ICD-10-CM

## 2014-11-11 DIAGNOSIS — I2510 Atherosclerotic heart disease of native coronary artery without angina pectoris: Secondary | ICD-10-CM

## 2014-11-11 MED ORDER — METOPROLOL SUCCINATE ER 25 MG PO TB24: 25.0000 mg | ORAL_TABLET | Freq: Every day | ORAL | Status: DC

## 2014-11-11 MED ORDER — METOPROLOL SUCCINATE ER 25 MG PO TB24: 12.5000 mg | ORAL_TABLET | Freq: Every day | ORAL | Status: DC

## 2014-11-11 NOTE — Patient Instructions (Addendum)
Your physician recommends that you schedule a follow-up appointment in: 6 weeks    STOP Lopressor   START Toprol XL 12.5  mg daily   Please get lab work ( CMET,Lipids) 1 week before next visit      Thank you for choosing [facility] !

## 2014-11-11 NOTE — Assessment & Plan Note (Signed)
LVEF 35-40% by recent echocardiogram in the setting of ACS. We will continue medical therapy and plan follow-up echocardiogram in the next 8 weeks.

## 2014-11-11 NOTE — Progress Notes (Signed)
Reason for visit: Hospital follow-up, CAD, cardiomyopathy  Clinical Summary Mr. Gardella is a 68 y.o.male former patient Dr. Stanford Breed, with recent history of recent anterior wall STEMI in October. This is my first meeting with him. I reviewed the records. He underwent placement of 2 bare metal stents in the proximal LAD as management of the culprit lesion. Baseline history includes multivessel disease status post prior CABG as shown below, LIMA to LAD and SVG to diagonal were found to be patent, SVG to RCA and SVG to circumflex were occluded. Patient did undergo subsequent staged intervention with DES to the proximal RCA and mid circumflex, failed attempt at intervention on an obtuse marginal branch however. He was managed medically, LVEF found to be in the range of 35-40%.  Labwork from October showed potassium 3.6, BUN 17, creatinine 0.9, cholesterol 192, triglycerides 188, HDL 26, LDL 128. He is tolerating high-dose Lipitor.  He presents today without complaints of angina, reports NYHA class II dyspnea. He states that he has been tolerating his medications. We did discuss changing his very low-dose Lopressor to Toprol-XL. He reports no bleeding problems on DAPT. We discussed the importance of continuing this antiplatelet regimen for at least a year.  We did discuss his cardiomyopathy and plans to follow up on LVEF down the road.   No Known Allergies  Current Outpatient Prescriptions  Medication Sig Dispense Refill  . aspirin EC 81 MG EC tablet Take 1 tablet (81 mg total) by mouth daily.    Marland Kitchen atorvastatin (LIPITOR) 80 MG tablet Take 1 tablet (80 mg total) by mouth daily at 6 PM. 30 tablet 11  . clopidogrel (PLAVIX) 75 MG tablet Take 1 tablet (75 mg total) by mouth daily with breakfast. 30 tablet 11  . furosemide (LASIX) 40 MG tablet Take 1 tablet (40 mg total) by mouth daily. 30 tablet 3  . lisinopril (PRINIVIL,ZESTRIL) 2.5 MG tablet Take 1 tablet (2.5 mg total) by mouth daily. 30 tablet 5    . nitroGLYCERIN (NITROSTAT) 0.4 MG SL tablet Place 1 tablet (0.4 mg total) under the tongue every 5 (five) minutes x 3 doses as needed for chest pain. 25 tablet 2  . potassium chloride SA (K-DUR,KLOR-CON) 20 MEQ tablet Take 1 tablet (20 mEq total) by mouth daily. 30 tablet 3  . metoprolol succinate (TOPROL XL) 25 MG 24 hr tablet Take 1 tablet (25 mg total) by mouth daily. 90 tablet 3   No current facility-administered medications for this visit.    Past Medical History  Diagnosis Date  . CAD (coronary artery disease)     a. 2006 s/p MI and multivessel CABG. b. multivessel CAD, cath 10/19/2014 BMS x2 to prox LAD, successful DES to LCx and RCA, unsuccessful PCI to OM  . Hyperlipidemia   . Ischemic cardiomyopathy     LVEF 35-40%  . Chronic combined systolic and diastolic heart failure   . Pre-diabetes     A1C 6.3    Past Surgical History  Procedure Laterality Date  . Coronary artery bypass graft    . Cardiac catheterization  10/19/2014    Part 2/2 staged cath, successful DES to LCx and RCA, unsuccessful PCI to OM  . Cardiac catheterization  10/16/2014    Part 1/2 staged cath, BMS x2 to prox LAD,    Family History  Problem Relation Age of Onset  . Alcoholism Father     Social History Mr. Burgueno reports that he has been smoking Cigarettes.  He has a 50 pack-year smoking  history. He does not have any smokeless tobacco history on file. Mr. Pabst reports that he does not drink alcohol.  Review of Systems Complete review of systems negative except as otherwise outlined in the clinical summary and also the following. A palpitations or syncope. 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  Physical Examination Filed Vitals:   11/11/14 1526  BP: 106/58  Pulse: 85   Filed Weights   11/11/14 1526  Weight: 205 lb (92.987 kg)   Overweight male, appears comfortable at rest. HEENT: Conjunctiva and lids  normal, oropharynx clear. Neck: Supple, no elevated JVP or carotid bruits, no thyromegaly. Lungs: Clear to auscultation, nonlabored breathing at rest. Cardiac: Regular rate and rhythm, no S3 or significant systolic murmur, no pericardial rub. Abdomen: Soft, nontender, bowel sounds present. Extremities: No pitting edema, distal pulses 2+. Skin: Warm and dry. Musculoskeletal: No kyphosis. Neuropsychiatric: Alert and oriented x3, affect grossly appropriate.   Problem List and Plan   CAD, multiple vessel Patient is stable at this time status post recent anterior wall infarct as outlined, urgent DES to the proximal LAD, followed by staged interventions to the circumflex and RCA. He has graft disease with prior history of CABG. Discussed the importance of continuing DAPT. We will change his Lopressor to Toprol-XL 12.5 mg daily. Follow-up arranged in the next 6 weeks.  Ischemic cardiomyopathy LVEF 35-40% by recent echocardiogram in the setting of ACS. We will continue medical therapy and plan follow-up echocardiogram in the next 8 weeks.  Hyperlipidemia LDL 128 in October. He is now on high-dose Lipitor. We will reassess liver and lipids for his next visit in 6 weeks.  Tobacco abuse Smoking cessation discussed.    Satira Sark, M.D., F.A.C.C.

## 2014-11-11 NOTE — Assessment & Plan Note (Signed)
Smoking cessation discussed 

## 2014-11-11 NOTE — Assessment & Plan Note (Signed)
LDL 128 in October. He is now on high-dose Lipitor. We will reassess liver and lipids for his next visit in 6 weeks.

## 2014-11-11 NOTE — Assessment & Plan Note (Signed)
Patient is stable at this time status post recent anterior wall infarct as outlined, urgent DES to the proximal LAD, followed by staged interventions to the circumflex and RCA. He has graft disease with prior history of CABG. Discussed the importance of continuing DAPT. We will change his Lopressor to Toprol-XL 12.5 mg daily. Follow-up arranged in the next 6 weeks.

## 2014-12-01 ENCOUNTER — Encounter (HOSPITAL_COMMUNITY): Payer: Self-pay | Admitting: Cardiovascular Disease

## 2014-12-21 ENCOUNTER — Encounter (HOSPITAL_COMMUNITY): Payer: Self-pay | Admitting: *Deleted

## 2014-12-30 DIAGNOSIS — I255 Ischemic cardiomyopathy: Secondary | ICD-10-CM | POA: Diagnosis not present

## 2014-12-31 LAB — LIPID PANEL
CHOLESTEROL: 145 mg/dL (ref 0–200)
HDL: 28 mg/dL — ABNORMAL LOW (ref >39)
LDL Cholesterol: 67 mg/dL (ref 0–99)
TRIGLYCERIDES: 248 mg/dL — AB (ref <150)
Total CHOL/HDL Ratio: 5.2 Ratio
VLDL: 50 mg/dL — ABNORMAL HIGH (ref 0–40)

## 2014-12-31 LAB — COMPREHENSIVE METABOLIC PANEL
ALBUMIN: 3.8 g/dL (ref 3.5–5.2)
ALK PHOS: 79 U/L (ref 39–117)
ALT: 18 U/L (ref 0–53)
AST: 16 U/L (ref 0–37)
BUN: 14 mg/dL (ref 6–23)
CHLORIDE: 105 meq/L (ref 96–112)
CO2: 27 mEq/L (ref 19–32)
Calcium: 9.3 mg/dL (ref 8.4–10.5)
Creat: 0.83 mg/dL (ref 0.50–1.35)
Glucose, Bld: 109 mg/dL — ABNORMAL HIGH (ref 70–99)
POTASSIUM: 3.9 meq/L (ref 3.5–5.3)
SODIUM: 139 meq/L (ref 135–145)
TOTAL PROTEIN: 6.8 g/dL (ref 6.0–8.3)
Total Bilirubin: 0.9 mg/dL (ref 0.2–1.2)

## 2015-01-02 ENCOUNTER — Ambulatory Visit (INDEPENDENT_AMBULATORY_CARE_PROVIDER_SITE_OTHER): Payer: Medicare Other | Admitting: Cardiology

## 2015-01-02 ENCOUNTER — Encounter: Payer: Self-pay | Admitting: Cardiology

## 2015-01-02 VITALS — BP 102/54 | HR 63 | Ht 72.0 in | Wt 211.0 lb

## 2015-01-02 DIAGNOSIS — I255 Ischemic cardiomyopathy: Secondary | ICD-10-CM | POA: Diagnosis not present

## 2015-01-02 DIAGNOSIS — I429 Cardiomyopathy, unspecified: Secondary | ICD-10-CM | POA: Diagnosis not present

## 2015-01-02 DIAGNOSIS — E785 Hyperlipidemia, unspecified: Secondary | ICD-10-CM

## 2015-01-02 MED ORDER — ATORVASTATIN CALCIUM 40 MG PO TABS: 40.0000 mg | ORAL_TABLET | Freq: Every day | ORAL | Status: AC

## 2015-01-02 NOTE — Assessment & Plan Note (Signed)
Reduce Lipitor 40 mg in the evening.

## 2015-01-02 NOTE — Patient Instructions (Signed)
Your physician recommends that you schedule a follow-up appointment in: 4 months with Dr.McDowell    Your physician has recommended you make the following change in your medication:      DECREASE Lipitor to 40 mg daily     Your physician has requested that you have an echocardiogram. Echocardiography is a painless test that uses sound waves to create images of your heart. It provides your doctor with information about the size and shape of your heart and how well your heart's chambers and valves are working. This procedure takes approximately one hour. There are no restrictions for this procedure.       Thank you for choosing [facility] !

## 2015-01-02 NOTE — Assessment & Plan Note (Signed)
Continue medical therapy and observation for now. No angina symptoms status post staged PCI back in October 2015 as outlined above.

## 2015-01-02 NOTE — Assessment & Plan Note (Signed)
Follow-up echocardiogram to be obtained. Most recent LVEF 35-40% range after event back in October 2015. We did discuss potential indications for ICD depending on LVEF, however at this point he did not want to pursue EP evaluation, even if LVEF remains reduced.

## 2015-01-02 NOTE — Progress Notes (Signed)
Reason for visit: CAD, cardiomyopathy  Clinical Summary Travis Gutierrez is a 69 y.o.male seen by me for the first time in November 2015. He comes in for a follow-up visit today. States that he's been doing fairly well. No angina. Stable NYHA class II dyspnea. No palpitations or chest pain. We reviewed his medications. He has had some leg cramping and we discussed cutting his Lipitor in half for now. Otherwise no bleeding problems on aspirin and Plavix. No nitroglycerin use.  Recent cardiac history includes anterior wall STEMI in October 2015. He underwent placement of 2 bare metal stents in the proximal LAD as management of the culprit lesion. Baseline history includes multivessel disease status post prior CABG as shown below, LIMA to LAD and SVG to diagonal were found to be patent, SVG to RCA and SVG to circumflex were occluded. Patient did undergo subsequent staged intervention with DES to the proximal RCA and mid circumflex, failed attempt at intervention on an obtuse marginal branch however. He was managed medically, LVEF found to be in the range of 35-40%.  Recent follow-up lab work showed cholesterol 145, triglycerides 248, HDL 28, and LDL 67 which is down from 128. LFTs were normal, potassium 3.9, BUN 14, creatine 0.8.  Follow-up echocardiogram is planned for reassessment of LVEF.    No Known Allergies  Current Outpatient Prescriptions  Medication Sig Dispense Refill  . aspirin EC 81 MG EC tablet Take 1 tablet (81 mg total) by mouth daily.    Marland Kitchen atorvastatin (LIPITOR) 40 MG tablet Take 1 tablet (40 mg total) by mouth daily at 6 PM. 90 tablet 3  . clopidogrel (PLAVIX) 75 MG tablet Take 1 tablet (75 mg total) by mouth daily with breakfast. 30 tablet 11  . Cyanocobalamin (B-12) 100 MCG TABS Take by mouth.    . furosemide (LASIX) 40 MG tablet Take 1 tablet (40 mg total) by mouth daily. 30 tablet 3  . lisinopril (PRINIVIL,ZESTRIL) 2.5 MG tablet Take 1 tablet (2.5 mg total) by mouth daily. 30  tablet 5  . metoprolol succinate (TOPROL XL) 25 MG 24 hr tablet Take 0.5 tablets (12.5 mg total) by mouth daily. 45 tablet 3  . Multiple Vitamin (MULTIVITAMIN) capsule Take 1 capsule by mouth daily.    . nitroGLYCERIN (NITROSTAT) 0.4 MG SL tablet Place 1 tablet (0.4 mg total) under the tongue every 5 (five) minutes x 3 doses as needed for chest pain. 25 tablet 2  . potassium chloride SA (K-DUR,KLOR-CON) 20 MEQ tablet Take 1 tablet (20 mEq total) by mouth daily. 30 tablet 3   No current facility-administered medications for this visit.    Past Medical History  Diagnosis Date  . CAD (coronary artery disease)     a. 2006 s/p MI and multivessel CABG. b. multivessel CAD, cath 10/19/2014 BMS x2 to prox LAD, successful DES to LCx and RCA, unsuccessful PCI to OM  . Hyperlipidemia   . Ischemic cardiomyopathy     LVEF 35-40%  . Chronic combined systolic and diastolic heart failure   . Pre-diabetes     A1C 6.3    Past Surgical History  Procedure Laterality Date  . Coronary artery bypass graft  2006  . Cardiac catheterization  10/19/2014    Part 2/2 staged cath, successful DES to LCx and RCA, unsuccessful PCI to OM  . Cardiac catheterization  10/16/2014    Part 1/2 staged cath, BMS x2 to prox LAD,  . Left heart catheterization with coronary/graft angiogram N/A 10/16/2014    Procedure:  LEFT HEART CATHETERIZATION WITH Beatrix Fetters;  Surgeon: Lorretta Harp, MD;  Location: [facility] CATH LAB;  Service: Cardiovascular;  Laterality: N/A;  . Percutaneous coronary stent intervention (pci-s) N/A 10/19/2014    Procedure: PERCUTANEOUS CORONARY STENT INTERVENTION (PCI-S);  Surgeon: Blane Ohara, MD;  Location: [facility] CATH LAB;  Service: Cardiovascular;  Laterality: N/A;  prox RCA     Social History Travis Gutierrez reports that he has been smoking Cigarettes.  He started smoking about 51 years ago. He has a 50 pack-year smoking history. He does not have any smokeless tobacco history on file. Travis Gutierrez reports that he does not drink alcohol.  Review of Systems Complete review of systems negative except as otherwise outlined in the clinical summary and also the following. Stable appetite. No orthopnea or PND. No claudication.  Physical Examination Filed Vitals:   01/02/15 1420  BP: 102/54  Pulse: 63   Filed Weights   01/02/15 1420  Weight: 211 lb (95.709 kg)    Overweight male, appears comfortable at rest. HEENT: Conjunctiva and lids normal, oropharynx clear. Neck: Supple, no elevated JVP or carotid bruits, no thyromegaly. Lungs: Clear to auscultation, nonlabored breathing at rest. Cardiac: Regular rate and rhythm, no S3 or significant systolic murmur, no pericardial rub. Abdomen: Soft, nontender, bowel sounds present. Extremities: No pitting edema, distal pulses 2+. Skin: Warm and dry. Musculoskeletal: No kyphosis. Neuropsychiatric: Alert and oriented x3, affect grossly appropriate.   Problem List and Plan   CAD, multiple vessel Continue medical therapy and observation for now. No angina symptoms status post staged PCI back in October 2015 as outlined above.  Ischemic cardiomyopathy Follow-up echocardiogram to be obtained. Most recent LVEF 35-40% range after event back in October 2015. We did discuss potential indications for ICD depending on LVEF, however at this point he did not want to pursue EP evaluation, even if LVEF remains reduced.  Hyperlipidemia Reduce Lipitor 40 mg in the evening.    Satira Sark, M.D., F.A.C.C.

## 2015-01-09 ENCOUNTER — Ambulatory Visit (HOSPITAL_COMMUNITY)
Admission: RE | Admit: 2015-01-09 | Discharge: 2015-01-09 | Disposition: A | Discharge status: Home / Self Care | Payer: Medicare Other | Source: Ambulatory Visit | Attending: Cardiology | Admitting: Cardiology

## 2015-01-09 DIAGNOSIS — I429 Cardiomyopathy, unspecified: Secondary | ICD-10-CM | POA: Insufficient documentation

## 2015-01-09 DIAGNOSIS — I517 Cardiomegaly: Secondary | ICD-10-CM | POA: Diagnosis not present

## 2015-01-09 NOTE — Progress Notes (Signed)
  Echocardiogram 2D Echocardiogram has been performed.  [place], [place] 01/09/2015, 3:43 PM

## 2015-03-20 ENCOUNTER — Other Ambulatory Visit: Payer: Self-pay | Admitting: Cardiology

## 2015-03-20 MED ORDER — POTASSIUM CHLORIDE CRYS ER 20 MEQ PO TBCR: 20.0000 meq | EXTENDED_RELEASE_TABLET | Freq: Every day | ORAL | Status: DC

## 2015-03-20 NOTE — Telephone Encounter (Signed)
Received fax refill request  Rx # [id number] Medication:  Potassium Cl Er 20 MEQ Tablet Qty 30 Sig:  Take 1 tablet by mouth once daily Physician:  Rozann Lesches

## 2015-03-20 NOTE — Telephone Encounter (Signed)
escribed refill 

## 2015-05-31 ENCOUNTER — Telehealth: Payer: Self-pay | Admitting: Cardiology

## 2015-05-31 MED ORDER — LISINOPRIL 2.5 MG PO TABS: 2.5000 mg | ORAL_TABLET | Freq: Every day | ORAL | Status: DC

## 2015-05-31 NOTE — Telephone Encounter (Signed)
[facility] called, pt had been notified he needed appt for future refills but was out of lisinopril. Had called [facility] to see if they could refill, they contacted our office. Pt is not seen by cardiologists at [facility] - Dr. Domenic Polite sees him in [place]. Will route message. Sent 30 day script to preferred pharm, no refills.

## 2015-05-31 NOTE — Telephone Encounter (Signed)
Will forward to terry goins to sched apt

## 2015-06-16 IMAGING — CR DG CHEST 1V PORT
2 series · 2 of 2 positions shown · non-contrast
Comparison: 07/25/2011.

CLINICAL DATA: Shortness of breath.

EXAM:
PORTABLE CHEST - 1 VIEW

[AP (1 of 2)]
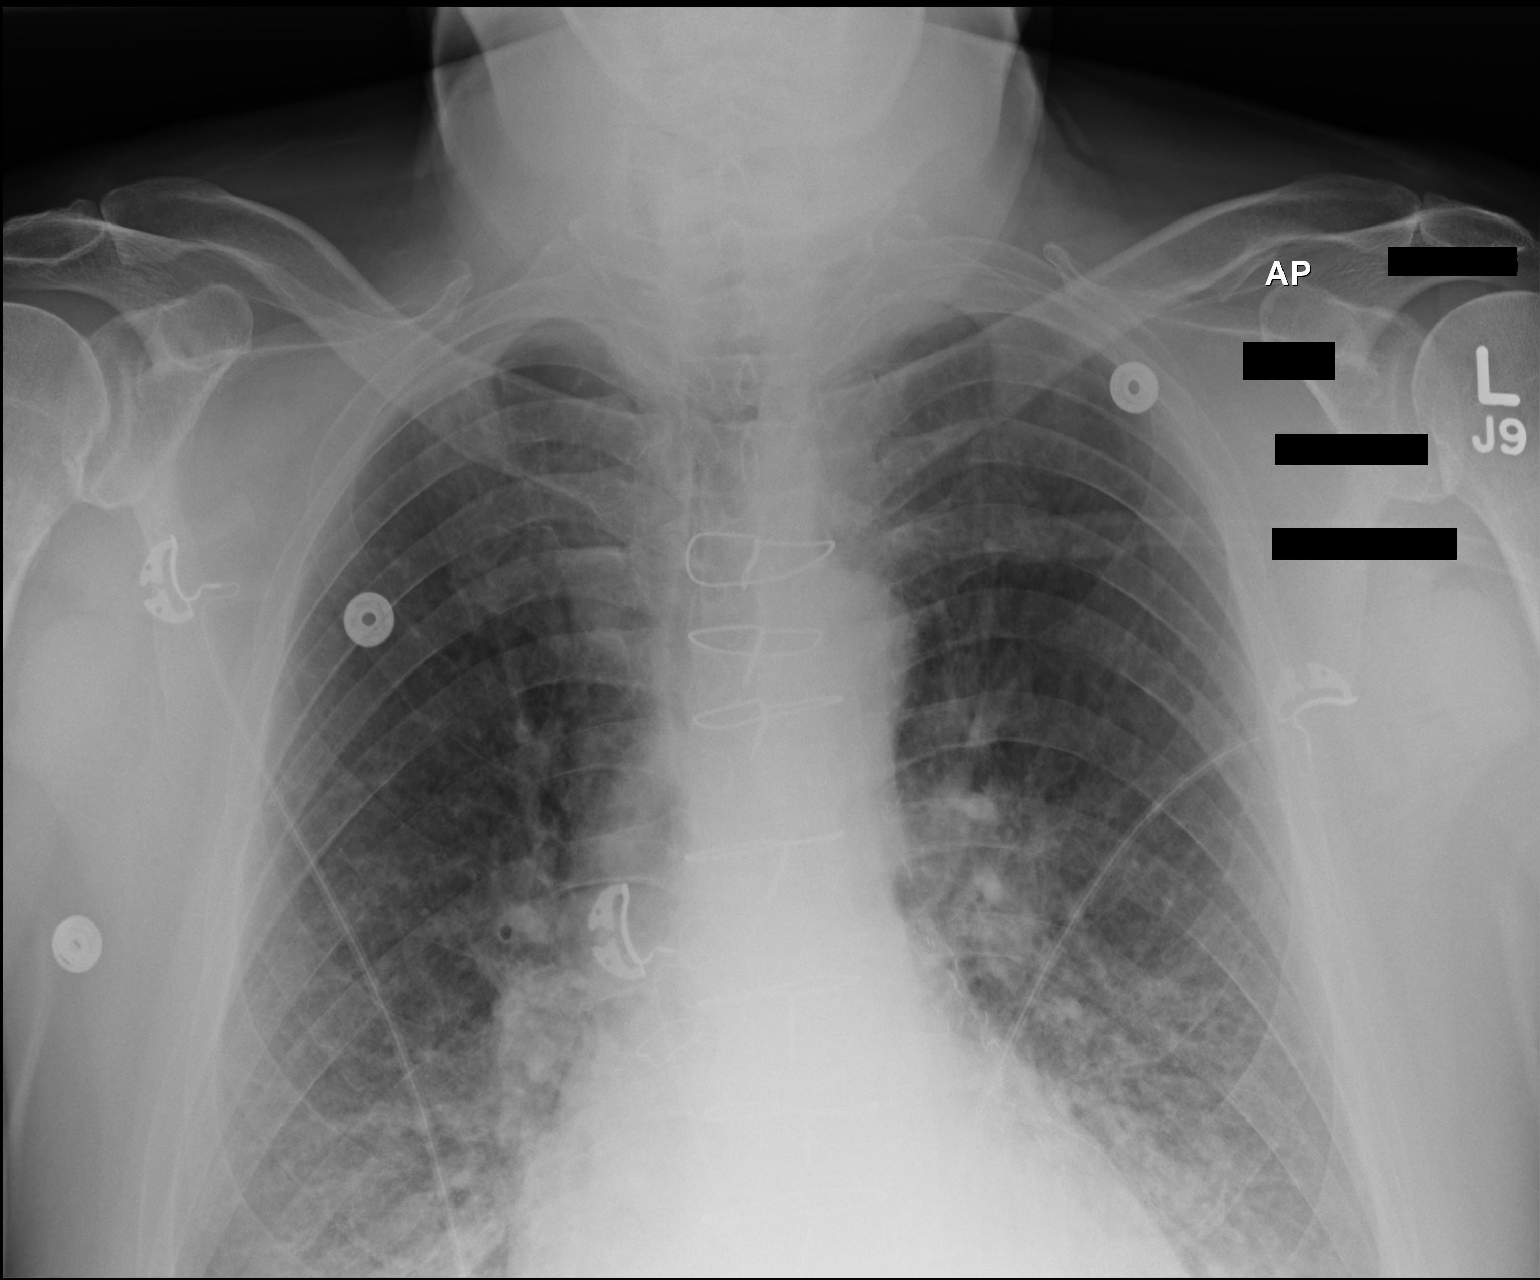

[AP (2 of 2)]
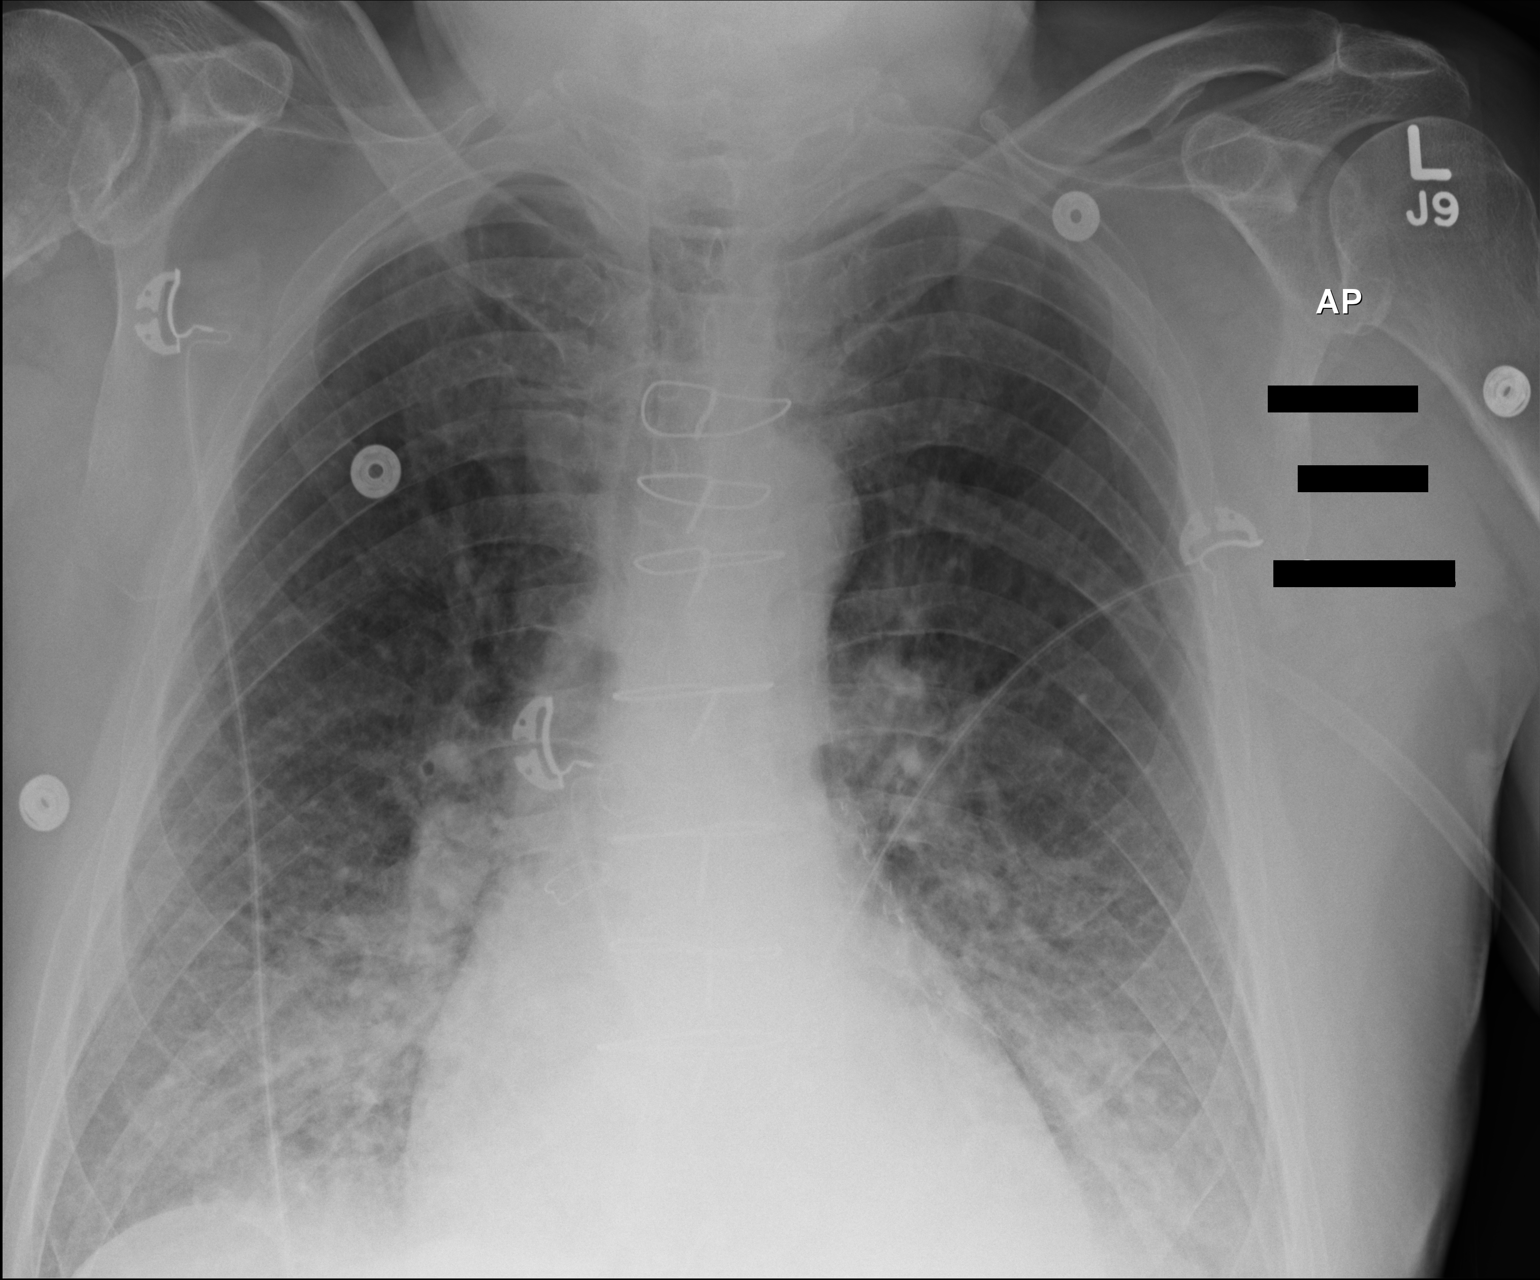

[2 of 2 positions shown; findings below may reference images not displayed]

FINDINGS: Prior CABG. Cardiomegaly with diffuse pulmonary alveolar infiltrates
consistent with congestive heart failure pulmonary edema noted. Lung
bases are not completely imaged. Pleural effusions can't be
excluded. No pneumothorax. No acute bony abnormality.
IMPRESSION: Congestive heart failure with pulmonary edema.  Prior CABG.

## 2015-07-19 ENCOUNTER — Other Ambulatory Visit: Payer: Self-pay

## 2015-07-19 MED ORDER — LISINOPRIL 2.5 MG PO TABS: 2.5000 mg | ORAL_TABLET | Freq: Every day | ORAL | Status: AC

## 2015-07-19 NOTE — Telephone Encounter (Signed)
Refill complete 

## 2015-09-29 DIAGNOSIS — Z23 Encounter for immunization: Secondary | ICD-10-CM | POA: Diagnosis not present

## 2015-10-27 ENCOUNTER — Other Ambulatory Visit: Payer: Self-pay | Admitting: Cardiology

## 2015-10-27 MED ORDER — POTASSIUM CHLORIDE CRYS ER 20 MEQ PO TBCR: 20.0000 meq | EXTENDED_RELEASE_TABLET | Freq: Every day | ORAL | Status: AC

## 2015-11-20 ENCOUNTER — Other Ambulatory Visit: Payer: Self-pay | Admitting: *Deleted

## 2015-11-20 MED ORDER — METOPROLOL SUCCINATE ER 25 MG PO TB24: 12.5000 mg | ORAL_TABLET | Freq: Every day | ORAL | Status: AC

## 2016-04-10 DIAGNOSIS — I5042 Chronic combined systolic (congestive) and diastolic (congestive) heart failure: Secondary | ICD-10-CM | POA: Diagnosis not present

## 2016-04-10 DIAGNOSIS — I25810 Atherosclerosis of coronary artery bypass graft(s) without angina pectoris: Secondary | ICD-10-CM | POA: Diagnosis not present

## 2016-04-10 DIAGNOSIS — I10 Essential (primary) hypertension: Secondary | ICD-10-CM | POA: Diagnosis not present

## 2016-04-10 DIAGNOSIS — E784 Other hyperlipidemia: Secondary | ICD-10-CM | POA: Diagnosis not present

## 2016-07-26 DIAGNOSIS — E784 Other hyperlipidemia: Secondary | ICD-10-CM | POA: Diagnosis not present

## 2016-07-26 DIAGNOSIS — I5042 Chronic combined systolic (congestive) and diastolic (congestive) heart failure: Secondary | ICD-10-CM | POA: Diagnosis not present

## 2016-07-26 DIAGNOSIS — I10 Essential (primary) hypertension: Secondary | ICD-10-CM | POA: Diagnosis not present

## 2016-07-26 DIAGNOSIS — R5383 Other fatigue: Secondary | ICD-10-CM | POA: Diagnosis not present

## 2016-10-15 DIAGNOSIS — Z23 Encounter for immunization: Secondary | ICD-10-CM | POA: Diagnosis not present

## 2017-05-09 DIAGNOSIS — E784 Other hyperlipidemia: Secondary | ICD-10-CM | POA: Diagnosis not present

## 2017-05-09 DIAGNOSIS — I10 Essential (primary) hypertension: Secondary | ICD-10-CM | POA: Diagnosis not present

## 2017-05-09 DIAGNOSIS — I5042 Chronic combined systolic (congestive) and diastolic (congestive) heart failure: Secondary | ICD-10-CM | POA: Diagnosis not present

## 2017-05-15 DIAGNOSIS — I700 Atherosclerosis of aorta: Secondary | ICD-10-CM | POA: Diagnosis not present

## 2017-05-15 DIAGNOSIS — R0989 Other specified symptoms and signs involving the circulatory and respiratory systems: Secondary | ICD-10-CM | POA: Diagnosis not present

## 2017-05-15 DIAGNOSIS — I5042 Chronic combined systolic (congestive) and diastolic (congestive) heart failure: Secondary | ICD-10-CM | POA: Diagnosis not present

## 2017-05-15 DIAGNOSIS — Z951 Presence of aortocoronary bypass graft: Secondary | ICD-10-CM | POA: Diagnosis not present

## 2017-05-15 DIAGNOSIS — J432 Centrilobular emphysema: Secondary | ICD-10-CM | POA: Diagnosis not present

## 2017-05-15 DIAGNOSIS — J438 Other emphysema: Secondary | ICD-10-CM | POA: Diagnosis not present

## 2017-11-10 DIAGNOSIS — I2583 Coronary atherosclerosis due to lipid rich plaque: Secondary | ICD-10-CM | POA: Diagnosis not present

## 2017-11-10 DIAGNOSIS — R7303 Prediabetes: Secondary | ICD-10-CM | POA: Diagnosis not present

## 2017-11-10 DIAGNOSIS — E7849 Other hyperlipidemia: Secondary | ICD-10-CM | POA: Diagnosis not present

## 2017-11-10 DIAGNOSIS — I5042 Chronic combined systolic (congestive) and diastolic (congestive) heart failure: Secondary | ICD-10-CM | POA: Diagnosis not present

## 2017-11-10 DIAGNOSIS — I10 Essential (primary) hypertension: Secondary | ICD-10-CM | POA: Diagnosis not present

## 2018-03-31 DIAGNOSIS — I10 Essential (primary) hypertension: Secondary | ICD-10-CM | POA: Diagnosis not present

## 2018-03-31 DIAGNOSIS — E7849 Other hyperlipidemia: Secondary | ICD-10-CM | POA: Diagnosis not present

## 2018-03-31 DIAGNOSIS — I5042 Chronic combined systolic (congestive) and diastolic (congestive) heart failure: Secondary | ICD-10-CM | POA: Diagnosis not present

## 2018-03-31 DIAGNOSIS — R7303 Prediabetes: Secondary | ICD-10-CM | POA: Diagnosis not present

## 2018-03-31 DIAGNOSIS — I2583 Coronary atherosclerosis due to lipid rich plaque: Secondary | ICD-10-CM | POA: Diagnosis not present

## 2018-03-31 DIAGNOSIS — Z125 Encounter for screening for malignant neoplasm of prostate: Secondary | ICD-10-CM | POA: Diagnosis not present

## 2018-09-28 DIAGNOSIS — I10 Essential (primary) hypertension: Secondary | ICD-10-CM | POA: Diagnosis not present

## 2018-09-28 DIAGNOSIS — I2583 Coronary atherosclerosis due to lipid rich plaque: Secondary | ICD-10-CM | POA: Diagnosis not present

## 2018-09-28 DIAGNOSIS — I5042 Chronic combined systolic (congestive) and diastolic (congestive) heart failure: Secondary | ICD-10-CM | POA: Diagnosis not present

## 2018-09-28 DIAGNOSIS — E7849 Other hyperlipidemia: Secondary | ICD-10-CM | POA: Diagnosis not present

## 2018-09-28 DIAGNOSIS — Z0000 Encounter for general adult medical examination without abnormal findings: Secondary | ICD-10-CM | POA: Diagnosis not present

## 2019-03-29 DIAGNOSIS — E7849 Other hyperlipidemia: Secondary | ICD-10-CM | POA: Diagnosis not present

## 2019-03-29 DIAGNOSIS — C44629 Squamous cell carcinoma of skin of left upper limb, including shoulder: Secondary | ICD-10-CM | POA: Diagnosis not present

## 2019-03-29 DIAGNOSIS — C4491 Basal cell carcinoma of skin, unspecified: Secondary | ICD-10-CM | POA: Diagnosis not present

## 2019-03-29 DIAGNOSIS — I5042 Chronic combined systolic (congestive) and diastolic (congestive) heart failure: Secondary | ICD-10-CM | POA: Diagnosis not present

## 2019-03-29 DIAGNOSIS — Z0000 Encounter for general adult medical examination without abnormal findings: Secondary | ICD-10-CM | POA: Diagnosis not present

## 2019-03-29 DIAGNOSIS — I2583 Coronary atherosclerosis due to lipid rich plaque: Secondary | ICD-10-CM | POA: Diagnosis not present

## 2019-03-29 DIAGNOSIS — I10 Essential (primary) hypertension: Secondary | ICD-10-CM | POA: Diagnosis not present

## 2019-07-21 ENCOUNTER — Other Ambulatory Visit: Payer: Self-pay

## 2019-09-28 DIAGNOSIS — I739 Peripheral vascular disease, unspecified: Secondary | ICD-10-CM | POA: Diagnosis not present

## 2019-09-28 DIAGNOSIS — I5042 Chronic combined systolic (congestive) and diastolic (congestive) heart failure: Secondary | ICD-10-CM | POA: Diagnosis not present

## 2019-09-28 DIAGNOSIS — I2583 Coronary atherosclerosis due to lipid rich plaque: Secondary | ICD-10-CM | POA: Diagnosis not present

## 2019-09-28 DIAGNOSIS — I10 Essential (primary) hypertension: Secondary | ICD-10-CM | POA: Diagnosis not present

## 2019-09-28 DIAGNOSIS — E7849 Other hyperlipidemia: Secondary | ICD-10-CM | POA: Diagnosis not present

## 2019-10-01 DIAGNOSIS — I739 Peripheral vascular disease, unspecified: Secondary | ICD-10-CM | POA: Diagnosis not present

## 2020-02-24 DIAGNOSIS — Z23 Encounter for immunization: Secondary | ICD-10-CM | POA: Diagnosis not present

## 2020-02-29 DIAGNOSIS — M5430 Sciatica, unspecified side: Secondary | ICD-10-CM | POA: Diagnosis not present

## 2020-02-29 DIAGNOSIS — E7849 Other hyperlipidemia: Secondary | ICD-10-CM | POA: Diagnosis not present

## 2020-02-29 DIAGNOSIS — I10 Essential (primary) hypertension: Secondary | ICD-10-CM | POA: Diagnosis not present

## 2020-02-29 DIAGNOSIS — I5042 Chronic combined systolic (congestive) and diastolic (congestive) heart failure: Secondary | ICD-10-CM | POA: Diagnosis not present

## 2020-02-29 DIAGNOSIS — I2583 Coronary atherosclerosis due to lipid rich plaque: Secondary | ICD-10-CM | POA: Diagnosis not present

## 2020-02-29 DIAGNOSIS — I739 Peripheral vascular disease, unspecified: Secondary | ICD-10-CM | POA: Diagnosis not present

## 2020-03-31 DIAGNOSIS — Z23 Encounter for immunization: Secondary | ICD-10-CM | POA: Diagnosis not present

## 2020-08-29 DIAGNOSIS — I10 Essential (primary) hypertension: Secondary | ICD-10-CM | POA: Diagnosis not present

## 2020-08-29 DIAGNOSIS — I5042 Chronic combined systolic (congestive) and diastolic (congestive) heart failure: Secondary | ICD-10-CM | POA: Diagnosis not present

## 2020-08-29 DIAGNOSIS — I2583 Coronary atherosclerosis due to lipid rich plaque: Secondary | ICD-10-CM | POA: Diagnosis not present

## 2020-08-29 DIAGNOSIS — E7849 Other hyperlipidemia: Secondary | ICD-10-CM | POA: Diagnosis not present

## 2020-08-29 DIAGNOSIS — M5430 Sciatica, unspecified side: Secondary | ICD-10-CM | POA: Diagnosis not present

## 2020-08-29 DIAGNOSIS — I739 Peripheral vascular disease, unspecified: Secondary | ICD-10-CM | POA: Diagnosis not present

## 2020-08-29 DIAGNOSIS — Z1331 Encounter for screening for depression: Secondary | ICD-10-CM | POA: Diagnosis not present

## 2020-08-29 DIAGNOSIS — Z0000 Encounter for general adult medical examination without abnormal findings: Secondary | ICD-10-CM | POA: Diagnosis not present

## 2021-01-04 DIAGNOSIS — Z23 Encounter for immunization: Secondary | ICD-10-CM | POA: Diagnosis not present

## 2021-06-14 DIAGNOSIS — I5042 Chronic combined systolic (congestive) and diastolic (congestive) heart failure: Secondary | ICD-10-CM | POA: Diagnosis not present

## 2021-06-14 DIAGNOSIS — E7849 Other hyperlipidemia: Secondary | ICD-10-CM | POA: Diagnosis not present

## 2021-06-14 DIAGNOSIS — M5430 Sciatica, unspecified side: Secondary | ICD-10-CM | POA: Diagnosis not present

## 2021-06-14 DIAGNOSIS — I2583 Coronary atherosclerosis due to lipid rich plaque: Secondary | ICD-10-CM | POA: Diagnosis not present

## 2021-06-14 DIAGNOSIS — I739 Peripheral vascular disease, unspecified: Secondary | ICD-10-CM | POA: Diagnosis not present

## 2021-06-14 DIAGNOSIS — I10 Essential (primary) hypertension: Secondary | ICD-10-CM | POA: Diagnosis not present

## 2021-09-21 DIAGNOSIS — I739 Peripheral vascular disease, unspecified: Secondary | ICD-10-CM | POA: Diagnosis not present

## 2021-09-21 DIAGNOSIS — I10 Essential (primary) hypertension: Secondary | ICD-10-CM | POA: Diagnosis not present

## 2021-09-21 DIAGNOSIS — I2583 Coronary atherosclerosis due to lipid rich plaque: Secondary | ICD-10-CM | POA: Diagnosis not present

## 2021-09-21 DIAGNOSIS — E7849 Other hyperlipidemia: Secondary | ICD-10-CM | POA: Diagnosis not present

## 2021-09-21 DIAGNOSIS — I5042 Chronic combined systolic (congestive) and diastolic (congestive) heart failure: Secondary | ICD-10-CM | POA: Diagnosis not present

## 2021-11-21 DIAGNOSIS — I10 Essential (primary) hypertension: Secondary | ICD-10-CM | POA: Diagnosis not present

## 2021-11-21 DIAGNOSIS — F02A4 Dementia in other diseases classified elsewhere, mild, with anxiety: Secondary | ICD-10-CM | POA: Diagnosis not present

## 2021-11-21 DIAGNOSIS — I5042 Chronic combined systolic (congestive) and diastolic (congestive) heart failure: Secondary | ICD-10-CM | POA: Diagnosis not present

## 2021-11-21 DIAGNOSIS — E7849 Other hyperlipidemia: Secondary | ICD-10-CM | POA: Diagnosis not present

## 2021-12-13 DIAGNOSIS — I5042 Chronic combined systolic (congestive) and diastolic (congestive) heart failure: Secondary | ICD-10-CM | POA: Diagnosis not present

## 2021-12-13 DIAGNOSIS — F02A4 Dementia in other diseases classified elsewhere, mild, with anxiety: Secondary | ICD-10-CM | POA: Diagnosis not present

## 2021-12-13 DIAGNOSIS — Z1331 Encounter for screening for depression: Secondary | ICD-10-CM | POA: Diagnosis not present

## 2021-12-13 DIAGNOSIS — I2583 Coronary atherosclerosis due to lipid rich plaque: Secondary | ICD-10-CM | POA: Diagnosis not present

## 2021-12-13 DIAGNOSIS — E7849 Other hyperlipidemia: Secondary | ICD-10-CM | POA: Diagnosis not present

## 2021-12-13 DIAGNOSIS — Z125 Encounter for screening for malignant neoplasm of prostate: Secondary | ICD-10-CM | POA: Diagnosis not present

## 2021-12-13 DIAGNOSIS — Z0000 Encounter for general adult medical examination without abnormal findings: Secondary | ICD-10-CM | POA: Diagnosis not present

## 2021-12-13 DIAGNOSIS — I739 Peripheral vascular disease, unspecified: Secondary | ICD-10-CM | POA: Diagnosis not present

## 2021-12-13 DIAGNOSIS — I10 Essential (primary) hypertension: Secondary | ICD-10-CM | POA: Diagnosis not present

## 2022-04-11 DIAGNOSIS — I5042 Chronic combined systolic (congestive) and diastolic (congestive) heart failure: Secondary | ICD-10-CM | POA: Diagnosis not present

## 2022-04-11 DIAGNOSIS — I2583 Coronary atherosclerosis due to lipid rich plaque: Secondary | ICD-10-CM | POA: Diagnosis not present

## 2022-04-11 DIAGNOSIS — I10 Essential (primary) hypertension: Secondary | ICD-10-CM | POA: Diagnosis not present

## 2022-04-11 DIAGNOSIS — E7849 Other hyperlipidemia: Secondary | ICD-10-CM | POA: Diagnosis not present

## 2022-04-11 DIAGNOSIS — I739 Peripheral vascular disease, unspecified: Secondary | ICD-10-CM | POA: Diagnosis not present

## 2022-08-15 DIAGNOSIS — I2583 Coronary atherosclerosis due to lipid rich plaque: Secondary | ICD-10-CM | POA: Diagnosis not present

## 2022-08-15 DIAGNOSIS — I5042 Chronic combined systolic (congestive) and diastolic (congestive) heart failure: Secondary | ICD-10-CM | POA: Diagnosis not present

## 2022-08-15 DIAGNOSIS — I10 Essential (primary) hypertension: Secondary | ICD-10-CM | POA: Diagnosis not present

## 2022-08-15 DIAGNOSIS — Z131 Encounter for screening for diabetes mellitus: Secondary | ICD-10-CM | POA: Diagnosis not present

## 2022-08-15 DIAGNOSIS — I739 Peripheral vascular disease, unspecified: Secondary | ICD-10-CM | POA: Diagnosis not present

## 2022-08-15 DIAGNOSIS — E7849 Other hyperlipidemia: Secondary | ICD-10-CM | POA: Diagnosis not present

## 2022-08-15 DIAGNOSIS — Z125 Encounter for screening for malignant neoplasm of prostate: Secondary | ICD-10-CM | POA: Diagnosis not present

## 2022-11-21 DIAGNOSIS — I2583 Coronary atherosclerosis due to lipid rich plaque: Secondary | ICD-10-CM | POA: Diagnosis not present

## 2022-11-21 DIAGNOSIS — I739 Peripheral vascular disease, unspecified: Secondary | ICD-10-CM | POA: Diagnosis not present

## 2022-11-21 DIAGNOSIS — I10 Essential (primary) hypertension: Secondary | ICD-10-CM | POA: Diagnosis not present

## 2022-11-21 DIAGNOSIS — I5042 Chronic combined systolic (congestive) and diastolic (congestive) heart failure: Secondary | ICD-10-CM | POA: Diagnosis not present

## 2022-11-21 DIAGNOSIS — E7849 Other hyperlipidemia: Secondary | ICD-10-CM | POA: Diagnosis not present

## 2023-03-11 DIAGNOSIS — M810 Age-related osteoporosis without current pathological fracture: Secondary | ICD-10-CM | POA: Diagnosis not present

## 2023-03-11 DIAGNOSIS — Z13820 Encounter for screening for osteoporosis: Secondary | ICD-10-CM | POA: Diagnosis not present

## 2023-05-20 DIAGNOSIS — I739 Peripheral vascular disease, unspecified: Secondary | ICD-10-CM | POA: Diagnosis not present

## 2023-05-20 DIAGNOSIS — I2583 Coronary atherosclerosis due to lipid rich plaque: Secondary | ICD-10-CM | POA: Diagnosis not present

## 2023-05-20 DIAGNOSIS — I5042 Chronic combined systolic (congestive) and diastolic (congestive) heart failure: Secondary | ICD-10-CM | POA: Diagnosis not present

## 2023-05-20 DIAGNOSIS — J449 Chronic obstructive pulmonary disease, unspecified: Secondary | ICD-10-CM | POA: Diagnosis not present

## 2023-05-20 DIAGNOSIS — E7849 Other hyperlipidemia: Secondary | ICD-10-CM | POA: Diagnosis not present

## 2023-05-20 DIAGNOSIS — I10 Essential (primary) hypertension: Secondary | ICD-10-CM | POA: Diagnosis not present

## 2023-05-23 DIAGNOSIS — I739 Peripheral vascular disease, unspecified: Secondary | ICD-10-CM | POA: Diagnosis not present

## 2023-05-23 DIAGNOSIS — I5042 Chronic combined systolic (congestive) and diastolic (congestive) heart failure: Secondary | ICD-10-CM | POA: Diagnosis not present

## 2023-05-23 DIAGNOSIS — I2583 Coronary atherosclerosis due to lipid rich plaque: Secondary | ICD-10-CM | POA: Diagnosis not present

## 2023-05-23 DIAGNOSIS — I10 Essential (primary) hypertension: Secondary | ICD-10-CM | POA: Diagnosis not present

## 2023-05-23 DIAGNOSIS — J449 Chronic obstructive pulmonary disease, unspecified: Secondary | ICD-10-CM | POA: Diagnosis not present

## 2023-05-23 DIAGNOSIS — E7849 Other hyperlipidemia: Secondary | ICD-10-CM | POA: Diagnosis not present

## 2023-11-13 DIAGNOSIS — I2583 Coronary atherosclerosis due to lipid rich plaque: Secondary | ICD-10-CM | POA: Diagnosis not present

## 2023-11-13 DIAGNOSIS — I739 Peripheral vascular disease, unspecified: Secondary | ICD-10-CM | POA: Diagnosis not present

## 2023-11-13 DIAGNOSIS — I5042 Chronic combined systolic (congestive) and diastolic (congestive) heart failure: Secondary | ICD-10-CM | POA: Diagnosis not present

## 2023-11-13 DIAGNOSIS — N1831 Chronic kidney disease, stage 3a: Secondary | ICD-10-CM | POA: Diagnosis not present

## 2023-11-13 DIAGNOSIS — J449 Chronic obstructive pulmonary disease, unspecified: Secondary | ICD-10-CM | POA: Diagnosis not present

## 2023-11-13 DIAGNOSIS — I10 Essential (primary) hypertension: Secondary | ICD-10-CM | POA: Diagnosis not present

## 2023-11-13 DIAGNOSIS — E7849 Other hyperlipidemia: Secondary | ICD-10-CM | POA: Diagnosis not present

## 2023-11-13 DIAGNOSIS — Z0000 Encounter for general adult medical examination without abnormal findings: Secondary | ICD-10-CM | POA: Diagnosis not present

## 2023-11-13 DIAGNOSIS — Z1331 Encounter for screening for depression: Secondary | ICD-10-CM | POA: Diagnosis not present

## 2024-03-18 DIAGNOSIS — E7849 Other hyperlipidemia: Secondary | ICD-10-CM | POA: Diagnosis not present

## 2024-03-18 DIAGNOSIS — I480 Paroxysmal atrial fibrillation: Secondary | ICD-10-CM | POA: Diagnosis not present

## 2024-03-18 DIAGNOSIS — I2583 Coronary atherosclerosis due to lipid rich plaque: Secondary | ICD-10-CM | POA: Diagnosis not present

## 2024-03-18 DIAGNOSIS — I739 Peripheral vascular disease, unspecified: Secondary | ICD-10-CM | POA: Diagnosis not present

## 2024-03-18 DIAGNOSIS — I5042 Chronic combined systolic (congestive) and diastolic (congestive) heart failure: Secondary | ICD-10-CM | POA: Diagnosis not present

## 2024-03-18 DIAGNOSIS — N182 Chronic kidney disease, stage 2 (mild): Secondary | ICD-10-CM | POA: Diagnosis not present

## 2024-03-18 DIAGNOSIS — J449 Chronic obstructive pulmonary disease, unspecified: Secondary | ICD-10-CM | POA: Diagnosis not present

## 2024-03-18 DIAGNOSIS — I10 Essential (primary) hypertension: Secondary | ICD-10-CM | POA: Diagnosis not present

## 2024-07-22 DIAGNOSIS — I5042 Chronic combined systolic (congestive) and diastolic (congestive) heart failure: Secondary | ICD-10-CM | POA: Diagnosis not present

## 2024-07-22 DIAGNOSIS — J449 Chronic obstructive pulmonary disease, unspecified: Secondary | ICD-10-CM | POA: Diagnosis not present

## 2024-07-22 DIAGNOSIS — I2583 Coronary atherosclerosis due to lipid rich plaque: Secondary | ICD-10-CM | POA: Diagnosis not present

## 2024-07-22 DIAGNOSIS — N182 Chronic kidney disease, stage 2 (mild): Secondary | ICD-10-CM | POA: Diagnosis not present

## 2024-07-22 DIAGNOSIS — E7849 Other hyperlipidemia: Secondary | ICD-10-CM | POA: Diagnosis not present

## 2024-07-22 DIAGNOSIS — I480 Paroxysmal atrial fibrillation: Secondary | ICD-10-CM | POA: Diagnosis not present

## 2024-07-22 DIAGNOSIS — I739 Peripheral vascular disease, unspecified: Secondary | ICD-10-CM | POA: Diagnosis not present

## 2024-07-22 DIAGNOSIS — I10 Essential (primary) hypertension: Secondary | ICD-10-CM | POA: Diagnosis not present

## 2024-08-13 DIAGNOSIS — Z7901 Long term (current) use of anticoagulants: Secondary | ICD-10-CM | POA: Diagnosis not present

## 2024-09-28 DIAGNOSIS — M5418 Radiculopathy, sacral and sacrococcygeal region: Secondary | ICD-10-CM | POA: Diagnosis not present

## 2024-09-28 DIAGNOSIS — I480 Paroxysmal atrial fibrillation: Secondary | ICD-10-CM | POA: Diagnosis not present

## 2024-11-17 DIAGNOSIS — Z7901 Long term (current) use of anticoagulants: Secondary | ICD-10-CM | POA: Diagnosis not present

## 2024-11-17 DIAGNOSIS — I5042 Chronic combined systolic (congestive) and diastolic (congestive) heart failure: Secondary | ICD-10-CM | POA: Diagnosis not present
